# Patient Record
Sex: Female | Born: 1940 | Race: White | Hispanic: No | Smoking: Current every day smoker
Health system: Southern US, Community
[De-identification: ages and names within clinical notes are randomized; demographics above are authoritative.]

## PROBLEM LIST (undated history)

## (undated) DIAGNOSIS — H9190 Unspecified hearing loss, unspecified ear: Secondary | ICD-10-CM

## (undated) DIAGNOSIS — M1990 Unspecified osteoarthritis, unspecified site: Secondary | ICD-10-CM

## (undated) DIAGNOSIS — I509 Heart failure, unspecified: Secondary | ICD-10-CM

---

## 2019-09-06 ENCOUNTER — Other Ambulatory Visit: Payer: Self-pay

## 2019-09-06 ENCOUNTER — Emergency Department (HOSPITAL_COMMUNITY): Payer: Medicare Other

## 2019-09-06 ENCOUNTER — Inpatient Hospital Stay (HOSPITAL_COMMUNITY)
Admission: EM | Admit: 2019-09-06 | Discharge: 2019-09-09 | DRG: 482 | Disposition: A | Discharge status: Home Health | Payer: Medicare Other | Attending: Internal Medicine | Admitting: Internal Medicine

## 2019-09-06 DIAGNOSIS — Y92009 Unspecified place in unspecified non-institutional (private) residence as the place of occurrence of the external cause: Secondary | ICD-10-CM

## 2019-09-06 DIAGNOSIS — S72142A Displaced intertrochanteric fracture of left femur, initial encounter for closed fracture: Principal | ICD-10-CM | POA: Diagnosis present

## 2019-09-06 DIAGNOSIS — Z419 Encounter for procedure for purposes other than remedying health state, unspecified: Secondary | ICD-10-CM

## 2019-09-06 DIAGNOSIS — S72002A Fracture of unspecified part of neck of left femur, initial encounter for closed fracture: Secondary | ICD-10-CM

## 2019-09-06 DIAGNOSIS — W19XXXA Unspecified fall, initial encounter: Secondary | ICD-10-CM

## 2019-09-06 DIAGNOSIS — F17210 Nicotine dependence, cigarettes, uncomplicated: Secondary | ICD-10-CM | POA: Diagnosis present

## 2019-09-06 DIAGNOSIS — Z20828 Contact with and (suspected) exposure to other viral communicable diseases: Secondary | ICD-10-CM | POA: Diagnosis present

## 2019-09-06 DIAGNOSIS — W08XXXA Fall from other furniture, initial encounter: Secondary | ICD-10-CM | POA: Diagnosis present

## 2019-09-06 DIAGNOSIS — E559 Vitamin D deficiency, unspecified: Secondary | ICD-10-CM | POA: Diagnosis present

## 2019-09-06 DIAGNOSIS — T148XXA Other injury of unspecified body region, initial encounter: Secondary | ICD-10-CM

## 2019-09-06 HISTORY — DX: Unspecified osteoarthritis, unspecified site: M19.90

## 2019-09-06 HISTORY — DX: Unspecified hearing loss, unspecified ear: H91.90

## 2019-09-06 MED ORDER — NICOTINE 21 MG/24HR TD PT24
21.0000 mg | MEDICATED_PATCH | Freq: Once | TRANSDERMAL | Status: AC
  Administered 2019-09-06: 21 mg via TRANSDERMAL
  Filled 2019-09-06: qty 1

## 2019-09-06 MED ORDER — FENTANYL CITRATE (PF) 100 MCG/2ML IJ SOLN
50.0000 ug | Freq: Once | INTRAMUSCULAR | Status: AC
  Administered 2019-09-06: 50 ug via INTRAVENOUS
  Filled 2019-09-06: qty 2

## 2019-09-06 NOTE — ED Notes (Signed)
Pt in xray at this time, will obtain EKG when pt returns to room.

## 2019-09-06 NOTE — ED Notes (Signed)
Patient transported to X-ray 

## 2019-09-06 NOTE — ED Notes (Signed)
Discussed NPO status with pt. And family.Pt verbalized understanding

## 2019-09-06 NOTE — ED Triage Notes (Signed)
Pt brought in by [facility] c/o fall. Pt states she "fell from step stool and heard a pop in her left hip" pt denies taking any daily medications. Pt denies LOC, denies room spinning, denies black out. Pt states she step off stool incorrectly.  HX HTN, no other medical history.  Pt A&Ox4  EMS VS BP 122/64 HR 90 NSR SPO2 93% RA RR 17

## 2019-09-06 NOTE — ED Provider Notes (Signed)
[facility]PART[facility] Provider Note   CSN: [id number] Arrival date & time: 09/06/19  2216     History   Chief Complaint Chief Complaint  Patient presents with  . Fall    HPI Annette Berry is a 78 y.o. female.     Patient to ED after stepping off her cough "the wrong way", falling and injuring her left hip. She did not hit her head. She denies headache, neck or back pain, chest pain, abdominal injury or SOB. She complains only of left hip pain where she heard a "pop" when she fell.   The history is provided by the patient and a relative. No language interpreter was used.  Fall Pertinent negatives include no chest pain, no abdominal pain, no headaches and no shortness of breath.    No past medical history on file.  There are no active problems to display for this patient.     OB History   No obstetric history on file.      Home Medications    Prior to Admission medications   Not on File    Family History No family history on file.  Social History Social History   Tobacco Use  . Smoking status: Not on file  Substance Use Topics  . Alcohol use: Not on file  . Drug use: Not on file     Allergies   Patient has no allergy information on record.   Review of Systems Review of Systems  Constitutional: Negative for diaphoresis.  Respiratory: Negative.  Negative for shortness of breath.   Cardiovascular: Negative.  Negative for chest pain.  Gastrointestinal: Negative.  Negative for abdominal pain and nausea.  Musculoskeletal:       See HPI.  Skin: Negative.  Negative for wound.  Neurological: Negative.  Negative for syncope and headaches.     Physical Exam Updated Vital Signs BP 131/90   Pulse 98   Temp 98.7 F (37.1 C) (Oral)   Resp 17   Ht 5\' 4"  (1.626 m)   Wt 54.4 kg   SpO2 93%   BMI 20.60 kg/m   Physical Exam Vitals signs and nursing note reviewed.  Constitutional:      Appearance: She is  well-developed.  HENT:     Head: Normocephalic.  Neck:     Musculoskeletal: Normal range of motion and neck supple.  Cardiovascular:     Rate and Rhythm: Normal rate and regular rhythm.  Pulmonary:     Effort: Pulmonary effort is normal.     Breath sounds: Rales present.  Chest:     Chest wall: No tenderness.  Abdominal:     General: Bowel sounds are normal.     Palpations: Abdomen is soft.     Tenderness: There is no abdominal tenderness. There is no guarding or rebound.  Musculoskeletal: Normal range of motion.     Comments: No midline or paraspinal tenderness over cervical, thoracic and lumbar spine. There is shortening and mild rotation of left LE. Tender over hip prominence on left. No distal thigh, knee or lower leg tenderness.    Skin:    General: Skin is warm and dry.     Findings: No rash.  Neurological:     Mental Status: She is alert.     Cranial Nerves: No cranial nerve deficit.      ED Treatments / Results  Labs (all labs ordered are listed, but only abnormal results are displayed) Labs Reviewed  CBC WITH DIFFERENTIAL/PLATELET  PROTIME-INR  COMPREHENSIVE METABOLIC PANEL    EKG None  Radiology No results found.  Procedures Procedures (including critical care time)  Medications Ordered in ED Medications - No data to display   Initial Impression / Assessment and Plan / ED Course  I have reviewed the triage vital signs and the nursing notes.  Pertinent labs & imaging results that were available during my care of the patient were reviewed by me and considered in my medical decision making (see chart for details).        Patient to ED after fall while stepping off of her couch, landing on left hip, hearing a "pop". No other injury. No medical problems or daily medications. She is a 2 ppd smoker.   Suspect left hip fracture with history of injury and leg shortening with rotation. No other injury identified.   Imaging confirms left intertrochanteric  fracture. Discussed with dr. Jena Gauss, ortho, who will plan for surgery tomorrow. Will keep her NPO after midnight.   Discussed with Dr. Toniann Fail, [facility], who accepts the patient for admission.  Final Clinical Impressions(s) / ED Diagnoses   Final diagnoses:  None   1. Left hip fracture   ED Discharge Orders    None       Elpidio Anis, PA-C 09/07/19 0044    Dione Booze, MD 09/07/19 [id number]

## 2019-09-06 NOTE — ED Notes (Signed)
PA at bedside.

## 2019-09-07 ENCOUNTER — Inpatient Hospital Stay (HOSPITAL_COMMUNITY): Payer: Medicare Other

## 2019-09-07 ENCOUNTER — Encounter (HOSPITAL_COMMUNITY): Payer: Self-pay | Admitting: Internal Medicine

## 2019-09-07 ENCOUNTER — Encounter (HOSPITAL_COMMUNITY)
Admission: EM | Disposition: A | Discharge status: Home Health | Payer: Self-pay | Source: Home / Self Care | Attending: Internal Medicine

## 2019-09-07 ENCOUNTER — Inpatient Hospital Stay (HOSPITAL_COMMUNITY): Payer: Medicare Other | Admitting: Certified Registered"

## 2019-09-07 DIAGNOSIS — Y92009 Unspecified place in unspecified non-institutional (private) residence as the place of occurrence of the external cause: Secondary | ICD-10-CM | POA: Diagnosis not present

## 2019-09-07 DIAGNOSIS — W08XXXA Fall from other furniture, initial encounter: Secondary | ICD-10-CM | POA: Diagnosis present

## 2019-09-07 DIAGNOSIS — W19XXXD Unspecified fall, subsequent encounter: Secondary | ICD-10-CM | POA: Diagnosis not present

## 2019-09-07 DIAGNOSIS — F17210 Nicotine dependence, cigarettes, uncomplicated: Secondary | ICD-10-CM | POA: Diagnosis present

## 2019-09-07 DIAGNOSIS — E559 Vitamin D deficiency, unspecified: Secondary | ICD-10-CM | POA: Diagnosis present

## 2019-09-07 DIAGNOSIS — S72002D Fracture of unspecified part of neck of left femur, subsequent encounter for closed fracture with routine healing: Secondary | ICD-10-CM | POA: Diagnosis not present

## 2019-09-07 DIAGNOSIS — S72142A Displaced intertrochanteric fracture of left femur, initial encounter for closed fracture: Secondary | ICD-10-CM

## 2019-09-07 DIAGNOSIS — S72002A Fracture of unspecified part of neck of left femur, initial encounter for closed fracture: Secondary | ICD-10-CM

## 2019-09-07 DIAGNOSIS — Z20828 Contact with and (suspected) exposure to other viral communicable diseases: Secondary | ICD-10-CM | POA: Diagnosis present

## 2019-09-07 HISTORY — PX: INTRAMEDULLARY (IM) NAIL INTERTROCHANTERIC: SHX5875

## 2019-09-07 LAB — COMPREHENSIVE METABOLIC PANEL
ALT: 13 U/L (ref 0–44)
ALT: 14 U/L (ref 0–44)
AST: 19 U/L (ref 15–41)
AST: 18 U/L (ref 15–41)
Albumin: 4 g/dL (ref 3.5–5.0)
Albumin: 3.7 g/dL (ref 3.5–5.0)
Alkaline Phosphatase: 63 U/L (ref 38–126)
Alkaline Phosphatase: 59 U/L (ref 38–126)
Anion gap: 12 (ref 5–15)
Anion gap: 10 (ref 5–15)
BUN: 16 mg/dL (ref 8–23)
BUN: 14 mg/dL (ref 8–23)
CO2: 22 mmol/L (ref 22–32)
CO2: 24 mmol/L (ref 22–32)
Calcium: 9.4 mg/dL (ref 8.9–10.3)
Calcium: 8.9 mg/dL (ref 8.9–10.3)
Chloride: 101 mmol/L (ref 98–111)
Chloride: 100 mmol/L (ref 98–111)
Creatinine, Ser: 0.73 mg/dL (ref 0.44–1.00)
Creatinine, Ser: 0.69 mg/dL (ref 0.44–1.00)
GFR calc Af Amer: >60 mL/min (ref >60)
GFR calc Af Amer: >60 mL/min (ref >60)
GFR calc non Af Amer: >60 mL/min (ref >60)
GFR calc non Af Amer: >60 mL/min (ref >60)
Glucose, Bld: 125 mg/dL — ABNORMAL HIGH (ref 70–99)
Glucose, Bld: 146 mg/dL — ABNORMAL HIGH (ref 70–99)
Potassium: 4.3 mmol/L (ref 3.5–5.1)
Potassium: 4.1 mmol/L (ref 3.5–5.1)
Sodium: 135 mmol/L (ref 135–145)
Sodium: 134 mmol/L — ABNORMAL LOW (ref 135–145)
Total Bilirubin: 0.2 mg/dL — ABNORMAL LOW (ref 0.3–1.2)
Total Bilirubin: 0.7 mg/dL (ref 0.3–1.2)
Total Protein: 6.8 g/dL (ref 6.5–8.1)
Total Protein: 6.3 g/dL — ABNORMAL LOW (ref 6.5–8.1)

## 2019-09-07 LAB — CBC WITH DIFFERENTIAL/PLATELET
Abs Immature Granulocytes: 0.06 10*3/uL (ref 0.00–0.07)
Abs Immature Granulocytes: 0.04 10*3/uL (ref 0.00–0.07)
Basophils Absolute: 0 10*3/uL (ref 0.0–0.1)
Basophils Absolute: 0 10*3/uL (ref 0.0–0.1)
Basophils Relative: 0 %
Basophils Relative: 0 %
Eosinophils Absolute: 0.1 10*3/uL (ref 0.0–0.5)
Eosinophils Absolute: 0 10*3/uL (ref 0.0–0.5)
Eosinophils Relative: 1 %
Eosinophils Relative: 0 %
HCT: 43.5 % (ref 36.0–46.0)
HCT: 38.7 % (ref 36.0–46.0)
Hemoglobin: 14.9 g/dL (ref 12.0–15.0)
Hemoglobin: 13.2 g/dL (ref 12.0–15.0)
Immature Granulocytes: 1 %
Immature Granulocytes: 0 %
Lymphocytes Relative: 13 %
Lymphocytes Relative: 9 %
Lymphs Abs: 1.5 10*3/uL (ref 0.7–4.0)
Lymphs Abs: 0.9 10*3/uL (ref 0.7–4.0)
MCH: 31.3 pg (ref 26.0–34.0)
MCH: 30.3 pg (ref 26.0–34.0)
MCHC: 34.3 g/dL (ref 30.0–36.0)
MCHC: 34.1 g/dL (ref 30.0–36.0)
MCV: 91.4 fL (ref 80.0–100.0)
MCV: 89 fL (ref 80.0–100.0)
Monocytes Absolute: 0.6 10*3/uL (ref 0.1–1.0)
Monocytes Absolute: 0.7 10*3/uL (ref 0.1–1.0)
Monocytes Relative: 6 %
Monocytes Relative: 7 %
Neutro Abs: 9.2 10*3/uL — ABNORMAL HIGH (ref 1.7–7.7)
Neutro Abs: 9.1 10*3/uL — ABNORMAL HIGH (ref 1.7–7.7)
Neutrophils Relative %: 79 %
Neutrophils Relative %: 84 %
Platelets: 298 10*3/uL (ref 150–400)
Platelets: 260 10*3/uL (ref 150–400)
RBC: 4.76 MIL/uL (ref 3.87–5.11)
RBC: 4.35 MIL/uL (ref 3.87–5.11)
RDW: 12.9 % (ref 11.5–15.5)
RDW: 12.6 % (ref 11.5–15.5)
WBC: 11.4 10*3/uL — ABNORMAL HIGH (ref 4.0–10.5)
WBC: 10.8 10*3/uL — ABNORMAL HIGH (ref 4.0–10.5)
nRBC: 0 % (ref 0.0–0.2)
nRBC: 0 % (ref 0.0–0.2)

## 2019-09-07 LAB — PROTIME-INR
INR: 1 (ref 0.8–1.2)
Prothrombin Time: 13.2 seconds (ref 11.4–15.2)

## 2019-09-07 LAB — VITAMIN D 25 HYDROXY (VIT D DEFICIENCY, FRACTURES): Vit D, 25-Hydroxy: 19.44 ng/mL — ABNORMAL LOW (ref 30–100)

## 2019-09-07 LAB — SURGICAL PCR SCREEN
MRSA, PCR: NEGATIVE
Staphylococcus aureus: NEGATIVE

## 2019-09-07 LAB — SARS CORONAVIRUS 2 (TAT 6-24 HRS): SARS Coronavirus 2: NEGATIVE

## 2019-09-07 SURGERY — FIXATION, FRACTURE, INTERTROCHANTERIC, WITH INTRAMEDULLARY ROD
Anesthesia: Spinal | Site: Hip | Laterality: Left

## 2019-09-07 MED ORDER — ACETAMINOPHEN 500 MG PO TABS
1000.0000 mg | ORAL_TABLET | Freq: Four times a day (QID) | ORAL | Status: DC
  Administered 2019-09-07 – 2019-09-09 (×9): 1000 mg via ORAL
  Filled 2019-09-07 (×9): qty 2

## 2019-09-07 MED ORDER — VANCOMYCIN HCL 1000 MG IV SOLR
INTRAVENOUS | Status: AC
  Filled 2019-09-07: qty 1000

## 2019-09-07 MED ORDER — ONDANSETRON HCL 4 MG/2ML IJ SOLN
INTRAMUSCULAR | Status: DC | PRN
  Administered 2019-09-07: 4 mg via INTRAVENOUS

## 2019-09-07 MED ORDER — PHENYLEPHRINE 40 MCG/ML (10ML) SYRINGE FOR IV PUSH (FOR BLOOD PRESSURE SUPPORT)
PREFILLED_SYRINGE | INTRAVENOUS | Status: DC | PRN
  Administered 2019-09-07 (×2): 200 ug via INTRAVENOUS

## 2019-09-07 MED ORDER — 0.9 % SODIUM CHLORIDE (POUR BTL) OPTIME
TOPICAL | Status: DC | PRN
  Administered 2019-09-07: 10:00:00 1000 mL

## 2019-09-07 MED ORDER — ENSURE ENLIVE PO LIQD: 237.0000 mL | Freq: Two times a day (BID) | ORAL | Status: DC

## 2019-09-07 MED ORDER — FENTANYL CITRATE (PF) 250 MCG/5ML IJ SOLN
INTRAMUSCULAR | Status: AC
  Filled 2019-09-07: qty 5

## 2019-09-07 MED ORDER — SODIUM CHLORIDE 0.9 % IV SOLN
INTRAVENOUS | Status: DC | PRN
  Administered 2019-09-07: 50 ug/min via INTRAVENOUS

## 2019-09-07 MED ORDER — SUCCINYLCHOLINE CHLORIDE 200 MG/10ML IV SOSY
PREFILLED_SYRINGE | INTRAVENOUS | Status: AC
  Filled 2019-09-07: qty 10

## 2019-09-07 MED ORDER — MORPHINE SULFATE (PF) 2 MG/ML IV SOLN
1.0000 mg | INTRAVENOUS | Status: DC | PRN
  Administered 2019-09-07: 1 mg via INTRAVENOUS
  Filled 2019-09-07: qty 1

## 2019-09-07 MED ORDER — FENTANYL CITRATE (PF) 100 MCG/2ML IJ SOLN: 25.0000 ug | INTRAMUSCULAR | Status: DC | PRN

## 2019-09-07 MED ORDER — VANCOMYCIN HCL 1000 MG IV SOLR
INTRAVENOUS | Status: DC | PRN
  Administered 2019-09-07: 1000 mg

## 2019-09-07 MED ORDER — DEXAMETHASONE SODIUM PHOSPHATE 10 MG/ML IJ SOLN
INTRAMUSCULAR | Status: DC | PRN
  Administered 2019-09-07: 10 mg via INTRAVENOUS

## 2019-09-07 MED ORDER — ROCURONIUM BROMIDE 10 MG/ML (PF) SYRINGE
PREFILLED_SYRINGE | INTRAVENOUS | Status: AC
  Filled 2019-09-07: qty 10

## 2019-09-07 MED ORDER — ONDANSETRON HCL 4 MG/2ML IJ SOLN: 4.0000 mg | Freq: Once | INTRAMUSCULAR | Status: DC | PRN

## 2019-09-07 MED ORDER — MORPHINE SULFATE (PF) 2 MG/ML IV SOLN: 0.5000 mg | INTRAVENOUS | Status: DC | PRN

## 2019-09-07 MED ORDER — ACETAMINOPHEN 500 MG PO TABS: 1000.0000 mg | ORAL_TABLET | Freq: Once | ORAL | Status: DC

## 2019-09-07 MED ORDER — MUPIROCIN 2 % EX OINT
1.0000 "application " | TOPICAL_OINTMENT | Freq: Two times a day (BID) | CUTANEOUS | Status: DC
  Administered 2019-09-08 – 2019-09-09 (×2): 1 via NASAL
  Filled 2019-09-07: qty 22

## 2019-09-07 MED ORDER — CEFAZOLIN SODIUM-DEXTROSE 2-4 GM/100ML-% IV SOLN
2.0000 g | Freq: Three times a day (TID) | INTRAVENOUS | Status: AC
  Administered 2019-09-07 – 2019-09-08 (×3): 2 g via INTRAVENOUS
  Filled 2019-09-07 (×3): qty 100

## 2019-09-07 MED ORDER — BUPIVACAINE IN DEXTROSE 0.75-8.25 % IT SOLN
INTRATHECAL | Status: DC | PRN
  Administered 2019-09-07: 1.6 mL via INTRATHECAL

## 2019-09-07 MED ORDER — TRAMADOL HCL 50 MG PO TABS
50.0000 mg | ORAL_TABLET | Freq: Four times a day (QID) | ORAL | Status: DC | PRN
  Administered 2019-09-07 – 2019-09-09 (×4): 50 mg via ORAL
  Filled 2019-09-07 (×4): qty 1

## 2019-09-07 MED ORDER — KETAMINE HCL 50 MG/5ML IJ SOSY
PREFILLED_SYRINGE | INTRAMUSCULAR | Status: AC
  Filled 2019-09-07: qty 5

## 2019-09-07 MED ORDER — PROPOFOL 10 MG/ML IV BOLUS
INTRAVENOUS | Status: AC
  Filled 2019-09-07: qty 20

## 2019-09-07 MED ORDER — KETAMINE HCL 10 MG/ML IJ SOLN
INTRAMUSCULAR | Status: DC | PRN
  Administered 2019-09-07: 25 mg via INTRAVENOUS

## 2019-09-07 MED ORDER — PROPOFOL 10 MG/ML IV BOLUS
INTRAVENOUS | Status: DC | PRN
  Administered 2019-09-07: 20 mg via INTRAVENOUS

## 2019-09-07 MED ORDER — CEFAZOLIN SODIUM-DEXTROSE 2-3 GM-%(50ML) IV SOLR
INTRAVENOUS | Status: DC | PRN
  Administered 2019-09-07: 2 g via INTRAVENOUS

## 2019-09-07 MED ORDER — LIDOCAINE 2% (20 MG/ML) 5 ML SYRINGE
INTRAMUSCULAR | Status: AC
  Filled 2019-09-07: qty 5

## 2019-09-07 MED ORDER — CEFAZOLIN SODIUM-DEXTROSE 1-4 GM/50ML-% IV SOLN
INTRAVENOUS | Status: AC
  Filled 2019-09-07: qty 50

## 2019-09-07 MED ORDER — MIDAZOLAM HCL 2 MG/2ML IJ SOLN
INTRAMUSCULAR | Status: AC
  Filled 2019-09-07: qty 2

## 2019-09-07 MED ORDER — ENOXAPARIN SODIUM 40 MG/0.4ML ~~LOC~~ SOLN
40.0000 mg | SUBCUTANEOUS | Status: DC
  Administered 2019-09-08 – 2019-09-09 (×2): 40 mg via SUBCUTANEOUS
  Filled 2019-09-07 (×2): qty 0.4

## 2019-09-07 MED ORDER — MIDAZOLAM HCL 5 MG/5ML IJ SOLN
INTRAMUSCULAR | Status: DC | PRN
  Administered 2019-09-07: 2 mg via INTRAVENOUS

## 2019-09-07 MED ORDER — LACTATED RINGERS IV SOLN
INTRAVENOUS | Status: DC
  Administered 2019-09-07 – 2019-09-08 (×2): via INTRAVENOUS

## 2019-09-07 SURGICAL SUPPLY — 48 items
BIT DRILL LONG 4.0 (BIT) ×1 IMPLANT
BRUSH SCRUB EZ PLAIN DRY (MISCELLANEOUS) ×6 IMPLANT
CHLORAPREP W/TINT 26 (MISCELLANEOUS) ×3 IMPLANT
COVER PERINEAL POST (MISCELLANEOUS) ×3 IMPLANT
COVER SURGICAL LIGHT HANDLE (MISCELLANEOUS) ×3 IMPLANT
COVER WAND RF STERILE (DRAPES) IMPLANT
DERMABOND ADVANCED (GAUZE/BANDAGES/DRESSINGS) ×2
DERMABOND ADVANCED .7 DNX12 (GAUZE/BANDAGES/DRESSINGS) ×1 IMPLANT
DRAPE C-ARM 35X43 STRL (DRAPES) ×3 IMPLANT
DRAPE IMP U-DRAPE 54X76 (DRAPES) ×6 IMPLANT
DRAPE INCISE IOBAN 66X45 STRL (DRAPES) ×3 IMPLANT
DRAPE STERI IOBAN 125X83 (DRAPES) ×3 IMPLANT
DRAPE SURG 17X23 STRL (DRAPES) ×6 IMPLANT
DRAPE U-SHAPE 47X51 STRL (DRAPES) ×3 IMPLANT
DRILL BIT LONG 4.0 (BIT) ×3
DRSG MEPILEX BORDER 4X4 (GAUZE/BANDAGES/DRESSINGS) ×3 IMPLANT
DRSG MEPILEX BORDER 4X8 (GAUZE/BANDAGES/DRESSINGS) ×3 IMPLANT
ELECT REM PT RETURN 9FT ADLT (ELECTROSURGICAL) ×3
ELECTRODE REM PT RTRN 9FT ADLT (ELECTROSURGICAL) ×1 IMPLANT
GLOVE BIO SURGEON STRL SZ 6.5 (GLOVE) ×6 IMPLANT
GLOVE BIO SURGEON STRL SZ7.5 (GLOVE) ×12 IMPLANT
GLOVE BIO SURGEONS STRL SZ 6.5 (GLOVE) ×3
GLOVE BIOGEL PI IND STRL 6.5 (GLOVE) ×1 IMPLANT
GLOVE BIOGEL PI IND STRL 7.5 (GLOVE) ×1 IMPLANT
GLOVE BIOGEL PI INDICATOR 6.5 (GLOVE) ×2
GLOVE BIOGEL PI INDICATOR 7.5 (GLOVE) ×2
GOWN STRL REUS W/ TWL LRG LVL3 (GOWN DISPOSABLE) ×1 IMPLANT
GOWN STRL REUS W/TWL LRG LVL3 (GOWN DISPOSABLE) ×2
GUIDE PIN 3.2X343 (PIN) ×2
GUIDE PIN 3.2X343MM (PIN) ×4
KIT BASIN OR (CUSTOM PROCEDURE TRAY) ×3 IMPLANT
KIT TURNOVER KIT B (KITS) ×3 IMPLANT
MANIFOLD NEPTUNE II (INSTRUMENTS) ×3 IMPLANT
NAIL INTERTAN 10X18 130D 10S (Nail) ×3 IMPLANT
NS IRRIG 1000ML POUR BTL (IV SOLUTION) ×3 IMPLANT
PACK GENERAL/GYN (CUSTOM PROCEDURE TRAY) ×3 IMPLANT
PAD ARMBOARD 7.5X6 YLW CONV (MISCELLANEOUS) ×6 IMPLANT
PIN GUIDE 3.2X343MM (PIN) ×2 IMPLANT
SCREW LAG COMPR KIT 90/85 (Screw) ×3 IMPLANT
SCREW TRIGEN LOW PROF 5.0X32.5 (Screw) ×3 IMPLANT
SUT MNCRL AB 3-0 PS2 18 (SUTURE) ×3 IMPLANT
SUT VIC AB 0 CT1 27 (SUTURE)
SUT VIC AB 0 CT1 27XBRD ANBCTR (SUTURE) IMPLANT
SUT VIC AB 2-0 CT1 27 (SUTURE) ×4
SUT VIC AB 2-0 CT1 TAPERPNT 27 (SUTURE) ×2 IMPLANT
TOWEL GREEN STERILE (TOWEL DISPOSABLE) ×6 IMPLANT
TRAY CATH 16FR W/PLASTIC CATH (SET/KITS/TRAYS/PACK) ×3 IMPLANT
WATER STERILE IRR 1000ML POUR (IV SOLUTION) ×3 IMPLANT

## 2019-09-07 NOTE — Progress Notes (Signed)
Patient seen in her room after surgery today, son at bedside.  Doing well with fairly good pain control.  Reports a couple episodes of emesis last night and this morning.  Suspect secondary to pain meds.  She has no other complaints at this time.  Agree with plans as outlined in admission H&P and per orthopedics.

## 2019-09-07 NOTE — ED Notes (Signed)
Son: Astrid Vides can be reached at [phone] for updates.   Son leaving at this time with pt all of belongings. Discussed visitation hours with son.

## 2019-09-07 NOTE — ED Notes (Signed)
ED TO INPATIENT HANDOFF REPORT  ED Nurse Name and Phone #:    S Name/Age/Gender Danice Goltz 78 y.o. female Room/Bed: 018C/018C  Code Status   Code Status: Not on file  Home/SNF/Other Home Patient oriented to: self, place, time and situation Is this baseline? Yes   Triage Complete: Triage complete  Chief Complaint fall hip pain  Triage Note Pt brought in by [facility] c/o fall. Pt states she "fell from step stool and heard a pop in her left hip" pt denies taking any daily medications. Pt denies LOC, denies room spinning, denies black out. Pt states she step off stool incorrectly.  HX HTN, no other medical history.  Pt A&Ox4  EMS VS BP 122/64 HR 90 NSR SPO2 93% RA RR 17      Allergies Not on File  Level of Care/Admitting Diagnosis ED Disposition    ED Disposition Condition [place]: [place] [[id number]]  Level of Care: Med-Surg [16]  Covid Evaluation: Asymptomatic Screening Protocol (No Symptoms)  Diagnosis: Closed left hip fracture, initial encounter [facility]) [[id number]]  Admitting Physician: Rise Patience [phone]  Attending Physician: Rise Patience [phone]  Estimated length of stay: past midnight tomorrow  Certification:: I certify this patient will need inpatient services for at least 2 midnights  PT Class (Do Not Modify): Inpatient [101]  PT Acc Code (Do Not Modify): Private [1]       B Medical/Surgery History No past medical history on file.   A IV Location/Drains/Wounds Patient Lines/Drains/Airways Status   Active Line/Drains/Airways    Name:   Placement date:   Placement time:   Site:   Days:   Peripheral IV 09/06/19 Left Antecubital   09/06/19    2333    Antecubital   1          Intake/Output Last 24 hours No intake or output data in the 24 hours ending 09/07/19 0247  Labs/Imaging Results for orders placed or performed during the hospital encounter of 09/06/19 (from the past 48  hour(s))  CBC with Differential     Status: Abnormal   Collection Time: 09/06/19 10:50 PM  Result Value Ref Range   WBC 11.4 (H) 4.0 - 10.5 K/uL   RBC 4.76 3.87 - 5.11 MIL/uL   Hemoglobin 14.9 12.0 - 15.0 g/dL   HCT 43.5 36.0 - 46.0 %   MCV 91.4 80.0 - 100.0 fL   MCH 31.3 26.0 - 34.0 pg   MCHC 34.3 30.0 - 36.0 g/dL   RDW 12.9 11.5 - 15.5 %   Platelets 298 150 - 400 K/uL   nRBC 0.0 0.0 - 0.2 %   Neutrophils Relative % 79 %   Neutro Abs 9.2 (H) 1.7 - 7.7 K/uL   Lymphocytes Relative 13 %   Lymphs Abs 1.5 0.7 - 4.0 K/uL   Monocytes Relative 6 %   Monocytes Absolute 0.6 0.1 - 1.0 K/uL   Eosinophils Relative 1 %   Eosinophils Absolute 0.1 0.0 - 0.5 K/uL   Basophils Relative 0 %   Basophils Absolute 0.0 0.0 - 0.1 K/uL   Immature Granulocytes 1 %   Abs Immature Granulocytes 0.06 0.00 - 0.07 K/uL    Comment: Performed at [facility], [address] [place], [place] [address]  Protime-INR     Status: None   Collection Time: 09/06/19 10:50 PM  Result Value Ref Range   Prothrombin Time 13.2 11.4 - 15.2 seconds  INR 1.0 0.8 - 1.2    Comment: (NOTE) INR goal varies based on device and disease states. Performed at [facility], [address], [place], [place] [address]   Comprehensive metabolic panel     Status: Abnormal   Collection Time: 09/06/19 10:50 PM  Result Value Ref Range   Sodium 135 135 - 145 mmol/L   Potassium 4.3 3.5 - 5.1 mmol/L   Chloride 101 98 - 111 mmol/L   CO2 22 22 - 32 mmol/L   Glucose, Bld 125 (H) 70 - 99 mg/dL   BUN 16 8 - 23 mg/dL   Creatinine, Ser 1.60 0.44 - 1.00 mg/dL   Calcium 9.4 8.9 - 10.9 mg/dL   Total Protein 6.8 6.5 - 8.1 g/dL   Albumin 4.0 3.5 - 5.0 g/dL   AST 19 15 - 41 U/L   ALT 13 0 - 44 U/L   Alkaline Phosphatase 63 38 - 126 U/L   Total Bilirubin 0.2 (L) 0.3 - 1.2 mg/dL   GFR calc non Af Amer >60 >60 mL/min   GFR calc Af Amer >60 >60 mL/min   Anion gap 12 5 - 15    Comment: Performed at [facility], [address], [place], [place] [address]   Dg Chest 1 View  Result Date: 09/06/2019 CLINICAL DATA:  Hip fracture EXAM: CHEST  1 VIEW COMPARISON:  None. FINDINGS: No focal opacity or pleural effusion. Normal cardiomediastinal silhouette with aortic atherosclerosis. No pneumothorax. IMPRESSION: No active disease. Electronically Signed   By: Jasmine Pang M.D.   On: 09/06/2019 23:13   Dg Hip Unilat With Pelvis 2-3 Views Left  Result Date: 09/06/2019 CLINICAL DATA:  Fall EXAM: DG HIP (WITH OR WITHOUT PELVIS) 2-3V LEFT COMPARISON:  None. FINDINGS: SI joints are non widened. Possible left sacral ala fracture. Pubic symphysis and rami appear intact. Both femoral heads project in joint. Acute mildly impacted left intertrochanteric fracture. Vascular calcifications. IMPRESSION: 1. Acute mildly impacted left intertrochanteric fracture 2. Possible left sacral ala fracture. Electronically Signed   By: Jasmine Pang M.D.   On: 09/06/2019 23:12    Pending Labs Unresulted Labs (From admission, onward)    Start     Ordered   09/06/19 2321  SARS CORONAVIRUS 2 (TAT 6-24 HRS) Nasopharyngeal Nasopharyngeal Swab  (Asymptomatic/Tier 2 Patients Labs)  Once,   STAT    Question Answer Comment  Is this test for diagnosis or screening Screening   Symptomatic for COVID-19 as defined by CDC No   Hospitalized for COVID-19 No   Admitted to ICU for COVID-19 No   Previously tested for COVID-19 No   Resident in a congregate (group) care setting No   Employed in healthcare setting No   Pregnant No      09/06/19 2320          Vitals/Pain Today's Vitals   09/06/19 2245 09/07/19 0005 09/07/19 0016 09/07/19 0100  BP: (!) 165/87 (!) 153/76  (!) 145/101  Pulse: 93 83  (!) 106  Resp:  11  13  Temp:      TempSrc:      SpO2: 97% 94%  93%  Weight:      Height:      PainSc:   5      Isolation Precautions No active isolations  Medications Medications  nicotine (NICODERM CQ - dosed in mg/24 hours) patch 21 mg (21 mg  Transdermal Patch Applied 09/06/19 2334)  fentaNYL (SUBLIMAZE) injection 50 mcg (50  mcg Intravenous Given 09/06/19 2334)    Mobility non-ambulatory Low fall risk   Focused Assessments Musculoskeletal    R Recommendations: See Admitting Provider Note  Report given to:   Additional Notes: -

## 2019-09-07 NOTE — Plan of Care (Signed)

## 2019-09-07 NOTE — Transfer of Care (Signed)
Immediate Anesthesia Transfer of Care Note  Patient: MINNETTA SANDORA  Procedure(s) Performed: INTRAMEDULLARY (IM) NAIL INTERTROCHANTRIC (Left Hip)  Patient Location: PACU  Anesthesia Type:MAC combined with regional for post-op pain  Level of Consciousness: drowsy and patient cooperative  Airway & Oxygen Therapy: Patient Spontanous Breathing  Post-op Assessment: Report given to RN and Post -op Vital signs reviewed and stable  Post vital signs: Reviewed and stable  Last Vitals:  Vitals Value Taken Time  BP    Temp    Pulse    Resp 13 09/07/19 1045  SpO2    Vitals shown include unvalidated device data.  Last Pain:  Vitals:   09/07/19 0801  TempSrc: Oral  PainSc:       Patients Stated Pain Goal: 1 (28/97/91 5041)  Complications: No apparent anesthesia complications

## 2019-09-07 NOTE — Anesthesia Postprocedure Evaluation (Signed)
Anesthesia Post Note  Patient: CAILYNN BODNAR  Procedure(s) Performed: INTRAMEDULLARY (IM) NAIL INTERTROCHANTRIC (Left Hip)     Patient location during evaluation: PACU Anesthesia Type: Spinal Level of consciousness: oriented and awake and alert Pain management: pain level controlled Vital Signs Assessment: post-procedure vital signs reviewed and stable Respiratory status: spontaneous breathing, respiratory function stable, patient connected to nasal cannula oxygen and nonlabored ventilation Cardiovascular status: blood pressure returned to baseline and stable Postop Assessment: no headache, no backache, no apparent nausea or vomiting, patient able to bend at knees and spinal receding Anesthetic complications: no    Last Vitals:  Vitals:   09/07/19 1130 09/07/19 1155  BP: 114/63 (!) 147/70  Pulse:  97  Resp: 13 15  Temp:  36.8 C  SpO2: 100% 98%    Last Pain:  Vitals:   09/07/19 1155  TempSrc:   PainSc: 0-No pain                 Catalina Gravel

## 2019-09-07 NOTE — Op Note (Signed)
Orthopaedic Surgery Operative Note (CSN: [id number] ) Date of Surgery: 09/07/2019  Admit Date: 09/06/2019   Diagnoses: Pre-Op Diagnoses: Left intertrochanteric femur fracture   Post-Op Diagnosis: Same  Procedures: CPT 27245-Cephalomedullary nailing of left intertrochanteric femur fracture  Surgeons : Primary: Roby Lofts, MD  Assistant: Ulyses Southward, PA-C  Location: OR 3   Anesthesia:General  Antibiotics: Ancef 2g preop   Tourniquet time:None  Estimated Blood Loss:100 mL  Complications:None   Specimens:None   Implants: Implant Name Type Inv. Item Serial No. Manufacturer Lot No. LRB No. Used Action  trigen intertan 10s nail     [id number] Left 1 Implanted  SCREW LAG COMBO 90.85 - [id number] Screw SCREW LAG COMBO 90.85  [employer] [id number] Left 1 Implanted  SCREW TRIGEN LOW PROF 5.0X32.5 - [id number] Screw SCREW TRIGEN LOW PROF 5.0X32.5  [employer] [id number] Left 1 Implanted     Indications for Surgery: 78 year old female with no past medical history but significant tobacco use history with a left intertrochanteric femur fracture. Due to the displacement and unstable nature of her injury I recommended proceeding with cephalomedullary nailing of left intertrochanteric femur fracture.  Risks and benefits were discussed with the patient.  Risks included but not limited to bleeding, infection, malunion, nonunion, hardware failure, cut out, DVT, nerve and blood vessel injury, even the possibility anesthetic complications.  Patient agrees to proceed with surgery and consent was obtained.   Operative Findings: Cephalomedullary nailing of left intertrochanteric femur fracture using [employer] InterTAN 10 x 170 millimeter short nail with 43mm lag and compression screw  Procedure: The patient was identified in the preoperative holding area. Consent was confirmed with the patient and their family and all questions were answered. The operative  extremity was marked after confirmation with the patient and they were then brought back to the operating room by our anesthesia colleagues. The patient was placed under general anesthesia and then carefully transferred over to a Hana table. The feet were secured into a traction boot and well padded. A post was placed in the groin and traction was pulled on the operative leg. The contralateral leg was positioned out of the way of fluoroscopy and secure . Fluoroscopic images were obtained and traction and manipulation was performed to reduce the fracture. Once adequate reduction was performed then the operative extremity was prepped and draped in sterile fashion. Preincision timeout was performed to verify the patient, the procedure and the extremity. Preoperative antibiotics were dosed.  A small incision was made proximal to the greater trochanter. A curved Mayo scissors was used to spread down to the greater trochanter in line with the abductor musculature. A threaded guidepin was positioned at an appropriate starting point on the AP and lateral views. It was advanced in the femur past the lesser trochanter. A entry reamer with soft tissue protector was then used to enter the canal. A 10 mm short nail was placed into the canal and seated down to an appropriate position radiographically. The targeting arm for the lag and compression screw was attached. A threaded guidepin was placed into the femoral neck and head and fluoroscopy was used to confirm adequate placement with an acceptable tip-apex distance. The compression screw position was drilled and anti-rotation bar was placed. The lag screw path was drilled and the lag screw was placed with good fixation. The compression screw was then placed and about 3 mm of compression was obtained. The set set was tightened and a distal interlocking screw was placed  using the jig.  Final fluoroscopic images were obtained and the incisions were copiously irrigated. The  skin was closed with 2-0 vicryl, 3-0 monocryl and sealed with dermabond. The incisions were dressing with Mepilex dressings. The patient was carefully transferred to the regular floor bed and was taken to PACU in stable condition.  Post Op Plan/Instructions: Patient will be weightbearing as tolerated to left lower extremity.  She will receive postoperative Ancef.  She will be started on Lovenox for DVT prophylaxis.  She will mobilize with physical and Occupational Therapy.  I was present and performed the entire surgery.  Patrecia Pace, PA-C did assist me throughout the case. An assistant was necessary given the difficulty in approach, maintenance of reduction and ability to instrument the fracture.  Katha Hamming, MD [facility]

## 2019-09-07 NOTE — Progress Notes (Signed)
Initial Nutrition Assessment  DOCUMENTATION CODES:   Not applicable  INTERVENTION:  Provide Ensure Enlive po BID, each supplement provides 350 kcal and 20 grams of protein  Encourage adequate PO intake.   NUTRITION DIAGNOSIS:   Increased nutrient needs related to post-op healing as evidenced by estimated needs.  GOAL:   Patient will meet greater than or equal to 90% of their needs  MONITOR:   PO intake, Supplement acceptance, Skin, Weight trends, Labs, I & O's  REASON FOR ASSESSMENT:   Consult Hip fracture protocol  ASSESSMENT:   78 y.o. female with no significant past medical history had a fall at home. X-rays revealed left hip fracture.   Procedures (10/12): Cephalomedullary nailing of left intertrochanteric femur fracture  Pt unavailable during attempted time of visit, in OR. RD unable to obtain pt nutrition history. RD to order nutritional supplements to aid in post op healing. Unable to complete Nutrition-Focused physical exam at this time.   Labs and medications reviewed.   Diet Order:   Diet Order            Diet regular Room service appropriate? Yes; Fluid consistency: Thin  Diet effective now              EDUCATION NEEDS:   Not appropriate for education at this time  Skin:  Skin Assessment: Skin Integrity Issues: Skin Integrity Issues:: Incisions Incisions: L hip  Last BM:  10/11  Height:   Ht Readings from Last 1 Encounters:  09/06/19 5\' 4"  (1.626 m)    Weight:   Wt Readings from Last 1 Encounters:  09/06/19 54.4 kg    Ideal Body Weight:  54.5 kg  BMI:  Body mass index is 20.6 kg/m.  Estimated Nutritional Needs:   Kcal:  1650-1850  Protein:  70-85 grams  Fluid:  >/= 1.6 L/day    Corrin Parker, MS, RD, LDN Pager # [phone] After hours/ weekend pager # [phone]

## 2019-09-07 NOTE — H&P (Signed)
History and Physical    PRISMA DECARLO LGX:[id number] DOB: Jun 30, 1941 DOA: 09/06/2019  PCP: Leonard Downing, MD  Patient coming from: Home.  Chief Complaint: Fall.  HPI: Annette Berry is a 78 y.o. female with no significant past medical history had a fall at home while patient was working on the window blinds.  Patient fell onto the floor did not hit her head or lose consciousness did not have any chest pain or shortness of breath.  Was brought to the ER.  ED Course: In the ER x-rays revealed left hip fracture.  On-call orthopedic surgeon Dr. Doreatha Martin has been consulted.  Patient admitted for hip fracture.  Labs revealed mild leukocytosis WBC count 11.4 glucose 125 creatinine 0.7 hemoglobin 14.9 platelets 298 EKG shows normal sinus rhythm with biatrial enlargement.  Chest x-ray is normal.  Patient admitted for left hip fracture secondary to mechanical fall.  Review of Systems: As per HPI, rest all negative.   History reviewed. No pertinent past medical history.  History reviewed. No pertinent surgical history.   reports that she has been smoking. She has never used smokeless tobacco. No history on file for alcohol and drug.  Not on File  Family History  Problem Relation Age of Onset  . Diabetes Mellitus I Neg Hx     Prior to Admission medications   Not on File    Physical Exam: Constitutional: Moderately built and nourished. Vitals:   09/06/19 2245 09/07/19 0005 09/07/19 0100 09/07/19 0350  BP: (!) 165/87 (!) 153/76 (!) 145/101 133/75  Pulse: 93 83 (!) 106 98  Resp:  11 13 17   Temp:    98.9 F (37.2 C)  TempSrc:    Oral  SpO2: 97% 94% 93% 95%  Weight:      Height:       Eyes: Anicteric no pallor. ENMT: No discharge from the ears eyes nose or mouth. Neck: No mass felt.  No neck rigidity. Respiratory: No rhonchi or crepitations. Cardiovascular: S1-S2 heard. Abdomen: Soft nontender bowel sounds present. Musculoskeletal: No edema.  Pain on  moving left hip. Skin: No rash. Neurologic: Alert awake oriented to time place and person.  Moves all extremities. Psychiatric: Appears normal per normal affect.   Labs on Admission: I have personally reviewed following labs and imaging studies  CBC: Recent Labs  Lab 09/06/19 2250  WBC 11.4*  NEUTROABS 9.2*  HGB 14.9  HCT 43.5  MCV 91.4  PLT 814   Basic Metabolic Panel: Recent Labs  Lab 09/06/19 2250  NA 135  K 4.3  CL 101  CO2 22  GLUCOSE 125*  BUN 16  CREATININE 0.73  CALCIUM 9.4   GFR: Estimated Creatinine Clearance: 50.6 mL/min (by C-G formula based on SCr of 0.73 mg/dL). Liver Function Tests: Recent Labs  Lab 09/06/19 2250  AST 19  ALT 13  ALKPHOS 63  BILITOT 0.2*  PROT 6.8  ALBUMIN 4.0   No results for input(s): LIPASE, AMYLASE in the last 168 hours. No results for input(s): AMMONIA in the last 168 hours. Coagulation Profile: Recent Labs  Lab 09/06/19 2250  INR 1.0   Cardiac Enzymes: No results for input(s): CKTOTAL, CKMB, CKMBINDEX, TROPONINI in the last 168 hours. BNP (last 3 results) No results for input(s): PROBNP in the last 8760 hours. HbA1C: No results for input(s): HGBA1C in the last 72 hours. CBG: No results for input(s): GLUCAP in the last 168 hours. Lipid Profile: No results for input(s): CHOL, HDL, LDLCALC, TRIG, CHOLHDL, LDLDIRECT  in the last 72 hours. Thyroid Function Tests: No results for input(s): TSH, T4TOTAL, FREET4, T3FREE, THYROIDAB in the last 72 hours. Anemia Panel: No results for input(s): VITAMINB12, FOLATE, FERRITIN, TIBC, IRON, RETICCTPCT in the last 72 hours. Urine analysis: No results found for: COLORURINE, APPEARANCEUR, LABSPEC, PHURINE, GLUCOSEU, HGBUR, BILIRUBINUR, KETONESUR, PROTEINUR, UROBILINOGEN, NITRITE, LEUKOCYTESUR Sepsis Labs: @LABRCNTIP (procalcitonin:4,lacticidven:4) ) Recent Results (from the past 240 hour(s))  SARS CORONAVIRUS 2 (TAT 6-24 HRS) Nasopharyngeal Nasopharyngeal Swab     Status: None    Collection Time: 09/06/19 11:39 PM   Specimen: Nasopharyngeal Swab  Result Value Ref Range Status   SARS Coronavirus 2 NEGATIVE NEGATIVE Final    Comment: (NOTE) SARS-CoV-2 target nucleic acids are NOT DETECTED. The SARS-CoV-2 RNA is generally detectable in upper and lower respiratory specimens during the acute phase of infection. Negative results do not preclude SARS-CoV-2 infection, do not rule out co-infections with other pathogens, and should not be used as the sole basis for treatment or other patient management decisions. Negative results must be combined with clinical observations, patient history, and epidemiological information. The expected result is Negative. Fact Sheet for Patients: 11/06/19 Fact Sheet for Healthcare Providers: HairSlick.no This test is not yet approved or cleared by the quierodirigir.com FDA and  has been authorized for detection and/or diagnosis of SARS-CoV-2 by FDA under an Emergency Use Authorization (EUA). This EUA will remain  in effect (meaning this test can be used) for the duration of the COVID-19 declaration under Section 56 4(b)(1) of the Act, 21 U.S.C. section 360bbb-3(b)(1), unless the authorization is terminated or revoked sooner. Performed at [facility], [address], [place], [place]      Radiological Exams on Admission: Dg Chest 1 View  Result Date: 09/06/2019 CLINICAL DATA:  Hip fracture EXAM: CHEST  1 VIEW COMPARISON:  None. FINDINGS: No focal opacity or pleural effusion. Normal cardiomediastinal silhouette with aortic atherosclerosis. No pneumothorax. IMPRESSION: No active disease. Electronically Signed   By: 11/06/2019 M.D.   On: 09/06/2019 23:13   Dg Hip Unilat With Pelvis 2-3 Views Left  Result Date: 09/06/2019 CLINICAL DATA:  Fall EXAM: DG HIP (WITH OR WITHOUT PELVIS) 2-3V LEFT COMPARISON:  None. FINDINGS: SI joints are non widened. Possible  left sacral ala fracture. Pubic symphysis and rami appear intact. Both femoral heads project in joint. Acute mildly impacted left intertrochanteric fracture. Vascular calcifications. IMPRESSION: 1. Acute mildly impacted left intertrochanteric fracture 2. Possible left sacral ala fracture. Electronically Signed   By: 11/06/2019 M.D.   On: 09/06/2019 23:12    EKG: Independently reviewed.  Normal sinus rhythm basal enlargement.  Assessment/Plan Principal Problem:   Closed left hip fracture, initial encounter (HCC)    1. Closed left hip fracture status post mechanical fall -patient be at moderate risk for intermediate procedure would keep patient n.p.o. pain relief medication orthopedics to see patient for surgery. 2. Tobacco abuse -tobacco cessation counseling requested.  Given the patient will need surgery and further care and will need more than 2 midnight stay will admit as inpatient.  COVID-19 is pending.   DVT prophylaxis: SCDs in anticipation of surgery. Code Status: Full code. Family Communication: Discussed with patient. Disposition Plan: May need rehab. Consults called: Orthopedics. Admission status: Inpatient.   11/06/2019 MD [facility] Pager [phone].  If 7PM-7AM, please contact night-coverage [email or web address] Password [email or web address]  09/07/2019, 4:56 AM

## 2019-09-07 NOTE — ED Notes (Signed)
Per Microbiology pt COVID to result at 3:45 am

## 2019-09-07 NOTE — Anesthesia Procedure Notes (Signed)
Spinal  Patient location during procedure: OR Start time: 09/07/2019 9:20 AM End time: 09/07/2019 9:25 AM Staffing Anesthesiologist: Catalina Gravel, MD Performed: anesthesiologist  Preanesthetic Checklist Completed: patient identified, surgical consent, pre-op evaluation, timeout performed, IV checked, risks and benefits discussed and monitors and equipment checked Spinal Block Patient position: left lateral decubitus Prep: site prepped and draped and DuraPrep Patient monitoring: continuous pulse ox and blood pressure Approach: midline Location: L3-4 Injection technique: single-shot Needle Needle type: Pencan  Needle gauge: 24 G Additional Notes Functioning IV was confirmed and monitors were applied. Sterile prep and drape, including hand hygiene, mask and sterile gloves were used. The patient was positioned and the spine was prepped. The skin was anesthetized with lidocaine.  Free flow of clear CSF was obtained prior to injecting local anesthetic into the CSF.  The spinal needle aspirated freely following injection.  The needle was carefully withdrawn.  The patient tolerated the procedure well. Consent was obtained prior to procedure with all questions answered and concerns addressed. Risks including but not limited to bleeding, infection, nerve damage, paralysis, failed block, inadequate analgesia, allergic reaction, high spinal, itching and headache were discussed and the patient wished to proceed.   Hoy Morn, MD

## 2019-09-07 NOTE — Plan of Care (Signed)
  Problem: Education: Goal: Knowledge of General Education information will improve Description Including pain rating scale, medication(s)/side effects and non-pharmacologic comfort measures Outcome: Progressing   Problem: Health Behavior/Discharge Planning: Goal: Ability to manage health-related needs will improve Outcome: Progressing   

## 2019-09-07 NOTE — Consult Note (Signed)
Orthopaedic Trauma Service (OTS) Consult   Patient ID: Annette Berry MRN: [id number] DOB/AGE: January 06, 1941 78 y.o.  Reason for Consult:Left hip fracture Referring Physician: Dr. Dione Booze, MD [facility]  HPI: Annette Berry is an 78 y.o. female who is being seen in consultation at the request of Dr. Preston Fleeting for evaluation of left intertrochanteric femur fracture.  The patient was standing on her couch to close her blinds when she tripped fell landed on her hip had immediate pop pain and inability to bear weight.  She was brought to the emergency room where x-rays showed a intertrochanteric femur fracture she has no major medical history.  Unfortunately she does not see a doctor regularly.  She was admitted to the hospitalist service. She smokes about a pack and a half a day.  Patient was seen and evaluated on 5 N.  She is having severe pain in her hip.  Hurts with any motion of the hip.  Denies any pain anywhere else.  Lives alone.  But has family close by.  Numbness or tingling complaints.  No shortness of breath or chest pain.  History reviewed. No pertinent past medical history.  History reviewed. No pertinent surgical history.  Family History  Problem Relation Age of Onset  . Diabetes Mellitus I Neg Hx     Social History:  reports that she has been smoking. She has never used smokeless tobacco. No history on file for alcohol and drug.  Allergies: Not on File  Medications:  No current facility-administered medications on file prior to encounter.    No current outpatient medications on file prior to encounter.    ROS: Constitutional: No fever or chills Vision: No changes in vision ENT: No difficulty swallowing CV: No chest pain Pulm: No SOB or wheezing GI: No nausea or vomiting GU: No urgency or inability to hold urine Skin: No poor wound healing Neurologic: No numbness or tingling Psychiatric: No depression or anxiety Heme: No bruising Allergic: No  reaction to medications or food   Exam: Blood pressure 133/75, pulse 98, temperature 98.9 F (37.2 C), temperature source Oral, resp. rate 17, height 5\' 4"  (1.626 m), weight 54.4 kg, SpO2 95 %. General: No acute distress Orientation: Awake alert and oriented x3 Mood and Affect: Cooperative and pleasant Gait: Unable to ambulate secondary to pain. Coordination and balance: Within normal limits  Left lower extremity reveals a skin without lesions.  Diffuse tenderness palpation about the hip.  No tenderness palpation about the knee or the ankle.  Unable to tolerate any range of motion of the hip or leg.  Able to move her ankle up and down.  Motor and sensory function intact to the left leg.  Warm well-perfused foot.  Compartments are soft and compressible.  No lymphadenopathy.  Reflexes are within normal limits.  No instability about the ankle or knee.  Right lower extremity: Skin without lesions. No tenderness to palpation. Full painless ROM, full strength in each muscle groups without evidence of instability.   Medical Decision Making: Data: Imaging: X-rays reveal a displaced shortened basicervical femoral neck/intertrochanteric fracture  Labs:  Results for orders placed or performed during the hospital encounter of 09/06/19 (from the past 24 hour(s))  CBC with Differential     Status: Abnormal   Collection Time: 09/06/19 10:50 PM  Result Value Ref Range   WBC 11.4 (H) 4.0 - 10.5 K/uL   RBC 4.76 3.87 - 5.11 MIL/uL   Hemoglobin 14.9 12.0 - 15.0 g/dL   HCT  43.5 36.0 - 46.0 %   MCV 91.4 80.0 - 100.0 fL   MCH 31.3 26.0 - 34.0 pg   MCHC 34.3 30.0 - 36.0 g/dL   RDW 16.112.9 09.611.5 - 04.515.5 %   Platelets 298 150 - 400 K/uL   nRBC 0.0 0.0 - 0.2 %   Neutrophils Relative % 79 %   Neutro Abs 9.2 (H) 1.7 - 7.7 K/uL   Lymphocytes Relative 13 %   Lymphs Abs 1.5 0.7 - 4.0 K/uL   Monocytes Relative 6 %   Monocytes Absolute 0.6 0.1 - 1.0 K/uL   Eosinophils Relative 1 %   Eosinophils Absolute 0.1 0.0  - 0.5 K/uL   Basophils Relative 0 %   Basophils Absolute 0.0 0.0 - 0.1 K/uL   Immature Granulocytes 1 %   Abs Immature Granulocytes 0.06 0.00 - 0.07 K/uL  Protime-INR     Status: None   Collection Time: 09/06/19 10:50 PM  Result Value Ref Range   Prothrombin Time 13.2 11.4 - 15.2 seconds   INR 1.0 0.8 - 1.2  Comprehensive metabolic panel     Status: Abnormal   Collection Time: 09/06/19 10:50 PM  Result Value Ref Range   Sodium 135 135 - 145 mmol/L   Potassium 4.3 3.5 - 5.1 mmol/L   Chloride 101 98 - 111 mmol/L   CO2 22 22 - 32 mmol/L   Glucose, Bld 125 (H) 70 - 99 mg/dL   BUN 16 8 - 23 mg/dL   Creatinine, Ser 4.090.73 0.44 - 1.00 mg/dL   Calcium 9.4 8.9 - 81.110.3 mg/dL   Total Protein 6.8 6.5 - 8.1 g/dL   Albumin 4.0 3.5 - 5.0 g/dL   AST 19 15 - 41 U/L   ALT 13 0 - 44 U/L   Alkaline Phosphatase 63 38 - 126 U/L   Total Bilirubin 0.2 (L) 0.3 - 1.2 mg/dL   GFR calc non Af Amer >60 >60 mL/min   GFR calc Af Amer >60 >60 mL/min   Anion gap 12 5 - 15  SARS CORONAVIRUS 2 (TAT 6-24 HRS) Nasopharyngeal Nasopharyngeal Swab     Status: None   Collection Time: 09/06/19 11:39 PM   Specimen: Nasopharyngeal Swab  Result Value Ref Range   SARS Coronavirus 2 NEGATIVE NEGATIVE  Surgical PCR screen     Status: None   Collection Time: 09/07/19  3:39 AM   Specimen: Nasal Mucosa; Nasal Swab  Result Value Ref Range   MRSA, PCR NEGATIVE NEGATIVE   Staphylococcus aureus NEGATIVE NEGATIVE  CBC WITH DIFFERENTIAL     Status: Abnormal   Collection Time: 09/07/19  5:14 AM  Result Value Ref Range   WBC 10.8 (H) 4.0 - 10.5 K/uL   RBC 4.35 3.87 - 5.11 MIL/uL   Hemoglobin 13.2 12.0 - 15.0 g/dL   HCT 91.438.7 78.236.0 - 95.646.0 %   MCV 89.0 80.0 - 100.0 fL   MCH 30.3 26.0 - 34.0 pg   MCHC 34.1 30.0 - 36.0 g/dL   RDW 21.312.6 08.611.5 - 57.815.5 %   Platelets 260 150 - 400 K/uL   nRBC 0.0 0.0 - 0.2 %   Neutrophils Relative % 84 %   Neutro Abs 9.1 (H) 1.7 - 7.7 K/uL   Lymphocytes Relative 9 %   Lymphs Abs 0.9 0.7 - 4.0 K/uL    Monocytes Relative 7 %   Monocytes Absolute 0.7 0.1 - 1.0 K/uL   Eosinophils Relative 0 %   Eosinophils Absolute 0.0 0.0 - 0.5 K/uL  Basophils Relative 0 %   Basophils Absolute 0.0 0.0 - 0.1 K/uL   Immature Granulocytes 0 %   Abs Immature Granulocytes 0.04 0.00 - 0.07 K/uL  Comprehensive metabolic panel     Status: Abnormal   Collection Time: 09/07/19  5:14 AM  Result Value Ref Range   Sodium 134 (L) 135 - 145 mmol/L   Potassium 4.1 3.5 - 5.1 mmol/L   Chloride 100 98 - 111 mmol/L   CO2 24 22 - 32 mmol/L   Glucose, Bld 146 (H) 70 - 99 mg/dL   BUN 14 8 - 23 mg/dL   Creatinine, Ser 0.69 0.44 - 1.00 mg/dL   Calcium 8.9 8.9 - 10.3 mg/dL   Total Protein 6.3 (L) 6.5 - 8.1 g/dL   Albumin 3.7 3.5 - 5.0 g/dL   AST 18 15 - 41 U/L   ALT 14 0 - 44 U/L   Alkaline Phosphatase 59 38 - 126 U/L   Total Bilirubin 0.7 0.3 - 1.2 mg/dL   GFR calc non Af Amer >60 >60 mL/min   GFR calc Af Amer >60 >60 mL/min   Anion gap 10 5 - 15    Imaging or Labs ordered: None  Medical history and chart was reviewed and case discussed with medical provider.  Assessment/Plan: 78 year old female with no past medical history but significant tobacco use history with a left intertrochanteric femur fracture.  Due to the displacement and unstable nature of her injury I recommend proceeding with cephalomedullary nailing of left intertrochanteric femur fracture.  Risks and benefits were discussed with the patient.  Risks included but not limited to bleeding, infection, malunion, nonunion, hardware failure, cut out, DVT, nerve and blood vessel injury, even the possibility anesthetic complications.  Patient agrees to proceed with surgery and consent will be obtained.  We will plan for surgery later this morning.  Shona Needles, MD [facility] [phone] (phone) [phone] (office) [email or web address]

## 2019-09-07 NOTE — Anesthesia Preprocedure Evaluation (Addendum)
Anesthesia Evaluation  Patient identified by MRN, date of birth, ID band Patient awake    Reviewed: Allergy & Precautions, NPO status , Patient's Chart, lab work & pertinent test results  Airway Mallampati: II  TM Distance: >3 FB Neck ROM: Full    Dental  (+) Dental Advisory Given, Edentulous Upper, Edentulous Lower   Pulmonary Current Smoker and Patient abstained from smoking.,    Pulmonary exam normal breath sounds clear to auscultation       Cardiovascular negative cardio ROS   Rhythm:Regular Rate:Tachycardia     Neuro/Psych negative neurological ROS  negative psych ROS   GI/Hepatic negative GI ROS, Neg liver ROS,   Endo/Other  negative endocrine ROS  Renal/GU negative Renal ROS     Musculoskeletal Left intertrochanteric femur fracture   Abdominal   Peds  Hematology negative hematology ROS (+)   Anesthesia Other Findings Day of surgery medications reviewed with the patient.  Reproductive/Obstetrics                            Anesthesia Physical Anesthesia Plan  ASA: III  Anesthesia Plan: Spinal   Post-op Pain Management:    Induction: Intravenous  PONV Risk Score and Plan: 2 and Propofol infusion and Treatment may vary due to age or medical condition  Airway Management Planned: Natural Airway and Nasal Cannula  Additional Equipment:   Intra-op Plan:   Post-operative Plan:   Informed Consent: I have reviewed the patients History and Physical, chart, labs and discussed the procedure including the risks, benefits and alternatives for the proposed anesthesia with the patient or authorized representative who has indicated his/her understanding and acceptance.     Dental advisory given  Plan Discussed with: CRNA  Anesthesia Plan Comments:        Anesthesia Quick Evaluation

## 2019-09-07 NOTE — Progress Notes (Signed)
Report called to Horris Latino, RN in short stay.

## 2019-09-07 NOTE — Care Management (Signed)
CM consult acknowledged to assist with any HH/DME needs. Awaiting PT/OT eval for DCP recommendations and will continue to follow.  Shakeyla Giebler RN, BSN, NCM-BC, ACM-RN [phone] 

## 2019-09-07 NOTE — Progress Notes (Signed)
Ortho Trauma Note  Reviewed case and imaging. Left intertrochanteric femur fracture. Plan for surgery tomorrow. NPO past midnight. Formal consult to follow in AM.  Shona Needles, MD [facility] [phone] (phone) [phone] (office) [email or web address]

## 2019-09-08 ENCOUNTER — Other Ambulatory Visit: Payer: Self-pay

## 2019-09-08 ENCOUNTER — Encounter (HOSPITAL_COMMUNITY): Payer: Self-pay | Admitting: General Practice

## 2019-09-08 DIAGNOSIS — S72002D Fracture of unspecified part of neck of left femur, subsequent encounter for closed fracture with routine healing: Secondary | ICD-10-CM

## 2019-09-08 DIAGNOSIS — W19XXXD Unspecified fall, subsequent encounter: Secondary | ICD-10-CM

## 2019-09-08 DIAGNOSIS — E559 Vitamin D deficiency, unspecified: Secondary | ICD-10-CM

## 2019-09-08 DIAGNOSIS — W19XXXA Unspecified fall, initial encounter: Secondary | ICD-10-CM

## 2019-09-08 LAB — CBC
HCT: 35 % — ABNORMAL LOW (ref 36.0–46.0)
Hemoglobin: 12.6 g/dL (ref 12.0–15.0)
MCH: 31.8 pg (ref 26.0–34.0)
MCHC: 36 g/dL (ref 30.0–36.0)
MCV: 88.4 fL (ref 80.0–100.0)
Platelets: 218 10*3/uL (ref 150–400)
RBC: 3.96 MIL/uL (ref 3.87–5.11)
RDW: 12.8 % (ref 11.5–15.5)
WBC: 9.5 10*3/uL (ref 4.0–10.5)
nRBC: 0 % (ref 0.0–0.2)

## 2019-09-08 LAB — COMPREHENSIVE METABOLIC PANEL
ALT: 12 U/L (ref 0–44)
AST: 18 U/L (ref 15–41)
Albumin: 3.2 g/dL — ABNORMAL LOW (ref 3.5–5.0)
Alkaline Phosphatase: 52 U/L (ref 38–126)
Anion gap: 9 (ref 5–15)
BUN: 12 mg/dL (ref 8–23)
CO2: 24 mmol/L (ref 22–32)
Calcium: 9 mg/dL (ref 8.9–10.3)
Chloride: 100 mmol/L (ref 98–111)
Creatinine, Ser: 0.54 mg/dL (ref 0.44–1.00)
GFR calc Af Amer: >60 mL/min (ref >60)
GFR calc non Af Amer: >60 mL/min (ref >60)
Glucose, Bld: 118 mg/dL — ABNORMAL HIGH (ref 70–99)
Potassium: 4.6 mmol/L (ref 3.5–5.1)
Sodium: 133 mmol/L — ABNORMAL LOW (ref 135–145)
Total Bilirubin: 0.7 mg/dL (ref 0.3–1.2)
Total Protein: 5.9 g/dL — ABNORMAL LOW (ref 6.5–8.1)

## 2019-09-08 LAB — HEMOGLOBIN A1C
Hgb A1c MFr Bld: 5.5 % (ref 4.8–5.6)
Mean Plasma Glucose: 111.15 mg/dL

## 2019-09-08 LAB — PHOSPHORUS: Phosphorus: 3.5 mg/dL (ref 2.5–4.6)

## 2019-09-08 MED ORDER — VITAMIN D 25 MCG (1000 UNIT) PO TABS
2000.0000 [IU] | ORAL_TABLET | Freq: Two times a day (BID) | ORAL | Status: DC
  Administered 2019-09-08 – 2019-09-09 (×3): 2000 [IU] via ORAL
  Filled 2019-09-08 (×3): qty 2

## 2019-09-08 MED ORDER — NICOTINE 21 MG/24HR TD PT24
21.0000 mg | MEDICATED_PATCH | Freq: Every day | TRANSDERMAL | Status: DC
  Administered 2019-09-08 – 2019-09-09 (×2): 21 mg via TRANSDERMAL
  Filled 2019-09-08 (×2): qty 1

## 2019-09-08 MED ORDER — ONDANSETRON HCL 4 MG/2ML IJ SOLN
4.0000 mg | Freq: Four times a day (QID) | INTRAMUSCULAR | Status: DC | PRN
  Administered 2019-09-09: 4 mg via INTRAVENOUS
  Filled 2019-09-08: qty 2

## 2019-09-08 NOTE — Evaluation (Signed)
Physical Therapy Evaluation Patient Details Name: Annette Berry MRN: [id number] DOB: 1941/03/27 Today's Date: 09/08/2019   History of Present Illness  78yo female presenting after a fall at home, found to have L hip fracture on x-ray. Received L IM nail placed 10/12, now WBAT. No significant PMH  Clinical Impression   Patient received in bed, HOH and very pleasant, reports she wants to go home when able. Required MinA for bed mobility, MinA of 1/Min guard of 1 for safety with sit to stand with RW, and able to gait train approximately 67ft with RW and min guard x2 for safety however did require one time MaxA due to LOB, also MaxA to lower softly to chair. She was positioned to comfort in chair, nurse tech aware of mobility status, chair alarm active. She will continue to benefit from skilled PT services in the acute setting, currently recommending SNF and 24/7 assist moving forward.     Follow Up Recommendations SNF;Supervision/Assistance - 24 hour    Equipment Recommendations  Rolling walker with 5" wheels;3in1 (PT)    Recommendations for Other Services       Precautions / Restrictions Precautions Precautions: Fall Restrictions Weight Bearing Restrictions: Yes LLE Weight Bearing: Weight bearing as tolerated      Mobility  Bed Mobility Overal bed mobility: Needs Assistance Bed Mobility: Supine to Sit     Supine to sit: Min assist     General bed mobility comments: MinA for L LE management  Transfers Overall transfer level: Needs assistance Equipment used: Rolling walker (2 wheeled) Transfers: Sit to/from Stand Sit to Stand: Min assist;Min guard         General transfer comment: MinA for boost to stand/min guard x1 for safety, cues for hand placement and sequencing  Ambulation/Gait Ambulation/Gait assistance: Min guard Gait Distance (Feet): 3 Feet Assistive device: Rolling walker (2 wheeled) Gait Pattern/deviations: Step-to pattern;Decreased step length -  right;Decreased stance time - left;Decreased weight shift to left;Antalgic;Trunk flexed Gait velocity: decreased   General Gait Details: antalgic step to pattern, cues for step through pattern and increased WB L LE, patient fatigues very easily and had one time need for MaxAx1 to maintain balance due to fatigue/LOB. Cues for safety backing into chair, MaxA for controlled lower  Stairs            Wheelchair Mobility    Modified Rankin (Stroke Patients Only)       Balance Overall balance assessment: Needs assistance Sitting-balance support: Bilateral upper extremity supported;Feet supported Sitting balance-Leahy Scale: Good     Standing balance support: Bilateral upper extremity supported;During functional activity Standing balance-Leahy Scale: Poor Standing balance comment: fatigues easily, reliant on B UE support                             Pertinent Vitals/Pain Pain Assessment: Faces Faces Pain Scale: Hurts little more Pain Location: L LE with movement Pain Descriptors / Indicators: Aching;Discomfort Pain Intervention(s): Limited activity within patient's tolerance;Monitored during session;Ice applied    Home Living Family/patient expects to be discharged to:: Private residence Living Arrangements: Alone Available Help at Discharge: Neighbor;Available PRN/intermittently;Family Type of Home: House Home Access: Stairs to enter Entrance Stairs-Rails: Right Entrance Stairs-Number of Steps: having a ramp built now, not finished yet; 4 steps with railing right side Home Layout: One level Home Equipment: Cane - quad Additional Comments: son has a walker she can use at home    Prior Function Level of Independence: Independent  Hand Dominance   Dominant Hand: Right    Extremity/Trunk Assessment   Upper Extremity Assessment Upper Extremity Assessment: Defer to OT evaluation    Lower Extremity Assessment Lower Extremity Assessment:  Generalized weakness    Cervical / Trunk Assessment Cervical / Trunk Assessment: Kyphotic  Communication   Communication: HOH  Cognition Arousal/Alertness: Awake/alert Behavior During Therapy: WFL for tasks assessed/performed;Anxious Overall Cognitive Status: Within Functional Limits for tasks assessed                                 General Comments: HOH      General Comments General comments (skin integrity, edema, etc.): resistant to idea of SNF    Exercises     Assessment/Plan    PT Assessment Patient needs continued PT services  PT Problem List Decreased strength;Decreased knowledge of use of DME;Decreased activity tolerance;Decreased safety awareness;Decreased balance;Decreased knowledge of precautions;Pain;Decreased mobility;Decreased coordination       PT Treatment Interventions DME instruction;Balance training;Gait training;Neuromuscular re-education;Stair training;Functional mobility training;Patient/family education;Therapeutic activities;Therapeutic exercise    PT Goals (Current goals can be found in the Care Plan section)  Acute Rehab PT Goals Patient Stated Goal: to go home PT Goal Formulation: With patient Time For Goal Achievement: 09/22/19 Potential to Achieve Goals: Good    Frequency Min 3X/week   Barriers to discharge Decreased caregiver support has family and neighbors that can be in intermittently, unsure if she would have true 24/7 assist/S    Co-evaluation               AM-PAC PT "6 Clicks" Mobility  Outcome Measure Help needed turning from your back to your side while in a flat bed without using bedrails?: A Little Help needed moving from lying on your back to sitting on the side of a flat bed without using bedrails?: A Little Help needed moving to and from a bed to a chair (including a wheelchair)?: A Lot Help needed standing up from a chair using your arms (e.g., wheelchair or bedside chair)?: A Little Help needed to  walk in hospital room?: A Lot Help needed climbing 3-5 steps with a railing? : A Lot 6 Click Score: 15    End of Session Equipment Utilized During Treatment: Gait belt Activity Tolerance: Patient limited by pain;Patient limited by fatigue Patient left: in chair;with call bell/phone within reach;with chair alarm set Nurse Communication: Mobility status PT Visit Diagnosis: Unsteadiness on feet (R26.81);Difficulty in walking, not elsewhere classified (R26.2);Pain;Muscle weakness (generalized) (M62.81);History of falling (Z91.81) Pain - Right/Left: Left Pain - part of body: Leg    Time: 1102-1130 PT Time Calculation (min) (ACUTE ONLY): 28 min   Charges:   PT Evaluation $PT Eval Low Complexity: 1 Low PT Treatments $Gait Training: 8-22 mins        Deniece Ree PT, DPT, CBIS  Supplemental Physical Therapist [place]    Pager [phone] Acute Rehab Office [phone]

## 2019-09-08 NOTE — Progress Notes (Addendum)
PROGRESS NOTE    Annette Berry  GQQ:[id number] DOB: [birth year] DOA: 09/06/2019 PCP: Leonard Downing, MD   Brief Narrative:  78 y.o. female with no significant past medical history had a fall at home while standing on a couch to adjust the window blinds.  Patient fell onto the floor, did not hit her head or lose consciousness.  Denied any chest pain, shortness of breath, dizziness or lightheadedness prior to the fall.  Was brought to the ER where x-rays revealed left hip fracture.  On-call orthopedic surgeon Dr. Doreatha Martin was consulted, admission recommended.  Labs revealed mild leukocytosis WBC count 11.4 glucose 125 creatinine 0.7 hemoglobin 14.9 platelets 298 EKG shows normal sinus rhythm with biatrial enlargement.  Chest x-ray is normal.  Patient admitted for left hip fracture secondary to mechanical fall.  She underwent cephalomedullary nailing of left intertrochanteric femur fracture the following morning.  Patient working with PT today.  Of note, patient does have vitamin D deficiency, started on supplementation.     Assessment & Plan:   Principal Problem:   Closed left hip fracture (HCC) Active Problems:   Vitamin D deficiency   Closed left hip fracture as a results of mechanical fall, status post surgical repair with Dr. Doreatha Martin.  Working with PT. - pain control - antiemetics PRN - expect will need rehab   Vitamin D Deficiency - started on vitamin D3 supplement - continue that on discharge - recommend DEXA if not done, suspect osteoporosis  Tobacco abuse - tobacco cessation counseling Smokes 2 ppd. Suspect she has COPD as she has very poor air movement. - nicotine patch      DVT prophylaxis: Lovenox Code Status:   Code Status: Full Code  Family Communication: no family at bedside  Disposition Plan: rehab in 1-2 days   Consultants:    Orthopedics, Dr. Doreatha Martin  Procedures:  cephalomedullary nailing of left intertrochanteric femur fracture on 09/07/19  with Dr. Doreatha Martin  Antimicrobials:   Perioperative Ancef   Subjective: Patient up in chair working with PT.  States pain fairly well-controlled.  Reports nausea with pain medication.  Denies fever chills, chest pain or SOB.  No other acute issues.  Objective: Vitals:   09/08/19 0411 09/08/19 0806 09/08/19 1234 09/08/19 2026  BP: (!) 149/61 (!) 156/66 130/89 (!) 158/64  Pulse: 90 70 90 92  Resp: 17 18 18 13   Temp: 98 F (36.7 C) 98.5 F (36.9 C) 98.2 F (36.8 C) 99 F (37.2 C)  TempSrc: Oral Oral Oral Oral  SpO2: 95% 94% 93% 94%  Weight:      Height:        Intake/Output Summary (Last 24 hours) at 09/08/2019 2241 Last data filed at 09/08/2019 1749 Gross per 24 hour  Intake 720.32 ml  Output 700 ml  Net 20.32 ml   Filed Weights   09/06/19 2221  Weight: 54.4 kg    Examination:  General exam: awake and alert, no acute distress  HEENT: hard of hearing, atraumatic, clear conjunctiva Respiratory system: poor air movement, clear to auscultation. Respiratory effort normal. Cardiovascular system: S1 & S2 heard, RRR. No JVD, murmurs, rubs, gallops or clicks. No pedal edema. Gastrointestinal system: Abdomen is nondistended, soft and nontender.  Central nervous system: Alert and oriented. Normal speech. No gross focal neurological deficits. Extremities: left lateral thigh with dressings in place, postop ecchymosis noted. Skin: No rashes, lesions or ulcers Psychiatry: normal mood, congruent affect  Data Reviewed: I have personally reviewed following labs and imaging studies  CBC:  Recent Labs  Lab 09/06/19 2250 09/07/19 0514 09/08/19 0316  WBC 11.4* 10.8* 9.5  NEUTROABS 9.2* 9.1*  --   HGB 14.9 13.2 12.6  HCT 43.5 38.7 35.0*  MCV 91.4 89.0 88.4  PLT 298 260 218   Basic Metabolic Panel: Recent Labs  Lab 09/06/19 2250 09/07/19 0514 09/08/19 0316  NA 135 134* 133*  K 4.3 4.1 4.6  CL 101 100 100  CO2 22 24 24   GLUCOSE 125* 146* 118*  BUN 16 14 12   CREATININE  0.73 0.69 0.54  CALCIUM 9.4 8.9 9.0  PHOS  --   --  3.5   GFR: Estimated Creatinine Clearance: 50.6 mL/min (by C-G formula based on SCr of 0.54 mg/dL). Liver Function Tests: Recent Labs  Lab 09/06/19 2250 09/07/19 0514 09/08/19 0316  AST 19 18 18   ALT 13 14 12   ALKPHOS 63 59 52  BILITOT 0.2* 0.7 0.7  PROT 6.8 6.3* 5.9*  ALBUMIN 4.0 3.7 3.2*   No results for input(s): LIPASE, AMYLASE in the last 168 hours. No results for input(s): AMMONIA in the last 168 hours. Coagulation Profile: Recent Labs  Lab 09/06/19 2250  INR 1.0   Cardiac Enzymes: No results for input(s): CKTOTAL, CKMB, CKMBINDEX, TROPONINI in the last 168 hours. BNP (last 3 results) No results for input(s): PROBNP in the last 8760 hours. HbA1C: Recent Labs    09/08/19 0316  HGBA1C 5.5   CBG: No results for input(s): GLUCAP in the last 168 hours. Lipid Profile: No results for input(s): CHOL, HDL, LDLCALC, TRIG, CHOLHDL, LDLDIRECT in the last 72 hours. Thyroid Function Tests: No results for input(s): TSH, T4TOTAL, FREET4, T3FREE, THYROIDAB in the last 72 hours. Anemia Panel: No results for input(s): VITAMINB12, FOLATE, FERRITIN, TIBC, IRON, RETICCTPCT in the last 72 hours. Sepsis Labs: No results for input(s): PROCALCITON, LATICACIDVEN in the last 168 hours.  Recent Results (from the past 240 hour(s))  SARS CORONAVIRUS 2 (TAT 6-24 HRS) Nasopharyngeal Nasopharyngeal Swab     Status: None   Collection Time: 09/06/19 11:39 PM   Specimen: Nasopharyngeal Swab  Result Value Ref Range Status   SARS Coronavirus 2 NEGATIVE NEGATIVE Final    Comment: (NOTE) SARS-CoV-2 target nucleic acids are NOT DETECTED. The SARS-CoV-2 RNA is generally detectable in upper and lower respiratory specimens during the acute phase of infection. Negative results do not preclude SARS-CoV-2 infection, do not rule out co-infections with other pathogens, and should not be used as the sole basis for treatment or other patient  management decisions. Negative results must be combined with clinical observations, patient history, and epidemiological information. The expected result is Negative. Fact Sheet for Patients: Fact Sheet for Healthcare Providers: 11/06/19 This test is not yet approved or cleared by the 09/10/19 FDA and  has been authorized for detection and/or diagnosis of SARS-CoV-2 by FDA under an Emergency Use Authorization (EUA). This EUA will remain  in effect (meaning this test can be used) for the duration of the COVID-19 declaration under Section 56 4(b)(1) of the Act, 21 U.S.C. section 360bbb-3(b)(1), unless the authorization is terminated or revoked sooner. Performed at [facility], [address], [place], [place] [facility]   Surgical PCR screen     Status: None   Collection Time: 09/07/19  3:39 AM   Specimen: Nasal Mucosa; Nasal Swab  Result Value Ref Range Status   MRSA, PCR NEGATIVE NEGATIVE Final   Staphylococcus aureus NEGATIVE NEGATIVE Final    Comment: (NOTE) The Xpert SA Assay (  FDA approved for NASAL specimens in patients 78 years of age and older), is one component of a comprehensive surveillance program. It is not intended to diagnose infection nor to guide or monitor treatment. Performed at [facility], [address] [place], [place] [address]          Radiology Studies: Dg Chest 1 View  Result Date: 09/06/2019 CLINICAL DATA:  Hip fracture EXAM: CHEST  1 VIEW COMPARISON:  None. FINDINGS: No focal opacity or pleural effusion. Normal cardiomediastinal silhouette with aortic atherosclerosis. No pneumothorax. IMPRESSION: No active disease. Electronically Signed   By: Jasmine PangKim  Fujinaga M.D.   On: 09/06/2019 23:13   Dg C-arm 1-60 Min  Result Date: 09/07/2019 CLINICAL DATA:  Fracture repair FLUOROSCOPY TIME:  1 minutes 23 seconds. Images: 5 EXAM: LEFT FEMUR 2 VIEWS; DG C-ARM 1-60  MIN COMPARISON:  None. FINDINGS: The left hip fracture is repair throughout the study with placement of 2 gamma nails and an intramedullary rod affixed distally with an interlocking screw. IMPRESSION: Left hip fracture repair as above. Electronically Signed   By: Gerome Samavid  Williams III M.D   On: 09/07/2019 10:28   Dg Hip Unilat W Or W/o Pelvis 2-3 Views Left  Result Date: 09/07/2019 CLINICAL DATA:  Repair of hip fracture. EXAM: DG HIP (WITH OR WITHOUT PELVIS) 2-3V LEFT COMPARISON:  September 07, 2019 FINDINGS: The patient is status post left hip fracture repair. Hardware including 2 gamma nails, an intramedullary rod, and a distal interlocking screw are in good position. IMPRESSION: Left hip fracture repair as above. Electronically Signed   By: Gerome Samavid  Williams III M.D   On: 09/07/2019 11:58   Dg Hip Unilat With Pelvis 2-3 Views Left  Result Date: 09/06/2019 CLINICAL DATA:  Fall EXAM: DG HIP (WITH OR WITHOUT PELVIS) 2-3V LEFT COMPARISON:  None. FINDINGS: SI joints are non widened. Possible left sacral ala fracture. Pubic symphysis and rami appear intact. Both femoral heads project in joint. Acute mildly impacted left intertrochanteric fracture. Vascular calcifications. IMPRESSION: 1. Acute mildly impacted left intertrochanteric fracture 2. Possible left sacral ala fracture. Electronically Signed   By: Jasmine PangKim  Fujinaga M.D.   On: 09/06/2019 23:12   Dg Femur Min 2 Views Left  Result Date: 09/07/2019 CLINICAL DATA:  Fracture repair FLUOROSCOPY TIME:  1 minutes 23 seconds. Images: 5 EXAM: LEFT FEMUR 2 VIEWS; DG C-ARM 1-60 MIN COMPARISON:  None. FINDINGS: The left hip fracture is repair throughout the study with placement of 2 gamma nails and an intramedullary rod affixed distally with an interlocking screw. IMPRESSION: Left hip fracture repair as above. Electronically Signed   By: Gerome Samavid  Williams III M.D   On: 09/07/2019 10:28        Scheduled Meds: . acetaminophen  1,000 mg Oral Q6H  . cholecalciferol   2,000 Units Oral BID  . enoxaparin (LOVENOX) injection  40 mg Subcutaneous Q24H  . feeding supplement (ENSURE ENLIVE)  237 mL Oral BID BM  . mupirocin ointment  1 application Nasal BID  . nicotine  21 mg Transdermal Daily   Continuous Infusions: . lactated ringers 10 mL/hr at 09/08/19 1358     LOS: 1 day    Time spent: 30-35 min    Pennie BanterKelly A Anie Juniel, DO [facility] Pager [phone]  If 7PM-7AM, please contact night-coverage [email or web address] Password [email or web address] 09/08/2019, 10:41 PM

## 2019-09-08 NOTE — Evaluation (Signed)
Occupational Therapy Evaluation Patient Details Name: Annette Berry MRN: [id number] DOB: September 06, 1941 Today's Date: 09/08/2019    History of Present Illness 78yo female presenting after a fall at home, found to have L hip fracture on x-ray. Received L IM nail placed 10/12, now WBAT. No significant PMH   Clinical Impression   Pt with decline in function and safety with ADLs and ADL mobility with impaired strength, balance and endurance. Pt reports that PTA, she lived ta home alone with family nearby and was independent with ADL/selfcare, home mgt and mobility. Pr currently requires max - total A with LB ADLs, min - min guard A with mobility using RW, max A with toileting and min A with sit - stand. Pt adamantly refusing ST SNF for rehab and states that her family can assist her 24/7. Pt would benefit from acute OT services to address impairments to maximize level of function and safety    Follow Up Recommendations  SNF(pt refusung SNF and states that her family and daughter in law that doesn't work will assist her)    Equipment Recommendations  3 in 1 bedside commode;Tub/shower seat;Other (comment)(reacher)    Recommendations for Other Services       Precautions / Restrictions Precautions Precautions: Fall Restrictions Weight Bearing Restrictions: Yes LLE Weight Bearing: Weight bearing as tolerated      Mobility Bed Mobility Overal bed mobility: Needs Assistance Bed Mobility: Supine to Sit     Supine to sit: Min assist     General bed mobility comments: pt in recliner upon arrival  Transfers Overall transfer level: Needs assistance Equipment used: Rolling walker (2 wheeled) Transfers: Sit to/from Stand Sit to Stand: Min assist;Min guard         General transfer comment: MinA for boost to stand/min guard x1 for safety, cues for hand placement and sequencing    Balance Overall balance assessment: Needs assistance Sitting-balance support: Bilateral upper  extremity supported;Feet supported Sitting balance-Leahy Scale: Good     Standing balance support: Bilateral upper extremity supported;During functional activity Standing balance-Leahy Scale: Poor Standing balance comment: fatigues easily, reliant on B UE support                           ADL either performed or assessed with clinical judgement   ADL Overall ADL's : Needs assistance/impaired Eating/Feeding: Independent;Sitting   Grooming: Wash/dry hands;Wash/dry face;Minimal assistance;Standing   Upper Body Bathing: Set up;Sitting   Lower Body Bathing: Maximal assistance   Upper Body Dressing : Set up;Sitting   Lower Body Dressing: Total assistance   Toilet Transfer: Minimal assistance;Min guard;Ambulation;RW;BSC;Cueing for safety   Toileting- Clothing Manipulation and Hygiene: Maximal assistance;Sit to/from stand       Functional mobility during ADLs: Minimal assistance;Min guard;Cueing for safety;Rolling walker       Vision Baseline Vision/History: Wears glasses Patient Visual Report: No change from baseline       Perception     Praxis      Pertinent Vitals/Pain Pain Assessment: 0-10 Pain Score: 3  Faces Pain Scale: Hurts little more Pain Location: L LE with movement Pain Descriptors / Indicators: Aching;Discomfort Pain Intervention(s): Monitored during session;Repositioned;Ice applied     Hand Dominance Right   Extremity/Trunk Assessment Upper Extremity Assessment Upper Extremity Assessment: Generalized weakness   Lower Extremity Assessment Lower Extremity Assessment: Defer to PT evaluation   Cervical / Trunk Assessment Cervical / Trunk Assessment: Kyphotic   Communication Communication Communication: HOH   Cognition Arousal/Alertness: Awake/alert  Behavior During Therapy: [facility] for tasks assessed/performed;Anxious Overall Cognitive Status: Within Functional Limits for tasks assessed                                  General Comments: HOH   General Comments  resistant to idea of SNF    Exercises     Shoulder Instructions      Home Living Family/patient expects to be discharged to:: Private residence Living Arrangements: Alone Available Help at Discharge: Neighbor;Available PRN/intermittently;Family Type of Home: House Home Access: Stairs to enter [employer] of Steps: having a ramp built now, not finished yet; 4 steps with railing right side Entrance Stairs-Rails: Right Home Layout: One level     Bathroom Shower/Tub: Producer, television/film/video: Standard Bathroom Accessibility: Yes   Home Equipment: Cane - quad   Additional Comments: son has a walker she can use at home      Prior Functioning/Environment Level of Independence: Independent                 OT Problem List: Decreased strength;Impaired balance (sitting and/or standing);Decreased activity tolerance;Decreased knowledge of use of DME or AE;Pain      OT Treatment/Interventions: Self-care/ADL training;DME and/or AE instruction;Therapeutic activities;Therapeutic exercise;Patient/family education    OT Goals(Current goals can be found in the care plan section) Acute Rehab OT Goals Patient Stated Goal: to go home OT Goal Formulation: With patient Time For Goal Achievement: 09/22/19 Potential to Achieve Goals: Good ADL Goals Pt Will Perform Grooming: with min guard assist;with supervision;with set-up;standing Pt Will Perform Lower Body Bathing: with mod assist;with caregiver independent in assisting Pt Will Perform Lower Body Dressing: with max assist;with mod assist;with adaptive equipment;with caregiver independent in assisting Pt Will Transfer to Toilet: with min guard assist;with supervision;ambulating Pt Will Perform Toileting - Clothing Manipulation and hygiene: with mod assist;with min assist;sit to/from stand Pt Will Perform Tub/Shower Transfer: with min guard assist;ambulating;shower seat;3 in  1;rolling walker  OT Frequency: Min 2X/week   Barriers to D/C: Decreased caregiver support          Co-evaluation              AM-PAC OT "6 Clicks" Daily Activity     Outcome Measure Help from another person eating meals?: None Help from another person taking care of personal grooming?: A Little Help from another person toileting, which includes using toliet, bedpan, or urinal?: A Lot Help from another person bathing (including washing, rinsing, drying)?: A Lot Help from another person to put on and taking off regular upper body clothing?: A Little Help from another person to put on and taking off regular lower body clothing?: Total 6 Click Score: 15   End of Session Equipment Utilized During Treatment: Gait belt;Rolling walker;Other (comment)(BSC)  Activity Tolerance: Patient tolerated treatment well Patient left: in chair;with call bell/phone within reach;with chair alarm set  OT Visit Diagnosis: Unsteadiness on feet (R26.81);Other abnormalities of gait and mobility (R26.89);Muscle weakness (generalized) (M62.81);History of falling (Z91.81);Pain Pain - Right/Left: Left Pain - part of body: Hip                Time: 1131-1156 OT Time Calculation (min): 25 min Charges:  OT General Charges $OT Visit: 1 Visit OT Treatments $Self Care/Home Management : 8-22 mins    Galen Manila 09/08/2019, 12:55 PM

## 2019-09-08 NOTE — Progress Notes (Signed)
Pt c/o nausea right after taking tramadol and tylenol. Vomited whole pills. Documented in MAR and intake/output.

## 2019-09-08 NOTE — Progress Notes (Signed)
Orthopaedic Trauma Progress Note  S: Doing well this morning, pain well controlled. Has not been up with therapy yet. No other issues noted  O:  Vitals:   09/08/19 0411 09/08/19 0806  BP: (!) 149/61 (!) 156/66  Pulse: 90 70  Resp: 17 18  Temp: 98 F (36.7 C) 98.5 F (36.9 C)  SpO2: 95% 94%    General - Sitting up in bed, NAD Cardiac -  Heart regular rate and rhythm Respiratory -  No increased work of breathing.  Left Lower Extremity - Dressings clean, dry, intact. Tender with palpation of lateral thigh. Ankle dorsiflexion/plantarflexion intact. Tolerate small amount of passive knee flexion/ sensation intact distally. Neurovascularly intact  Imaging: Stable post op imaging.   Labs:  Results for orders placed or performed during the hospital encounter of 09/06/19 (from the past 24 hour(s))  VITAMIN D 25 Hydroxy (Vit-D Deficiency, Fractures)     Status: Abnormal   Collection Time: 09/07/19  1:28 PM  Result Value Ref Range   Vit D, 25-Hydroxy 19.44 (L) 30 - 100 ng/mL  CBC     Status: Abnormal   Collection Time: 09/08/19  3:16 AM  Result Value Ref Range   WBC 9.5 4.0 - 10.5 K/uL   RBC 3.96 3.87 - 5.11 MIL/uL   Hemoglobin 12.6 12.0 - 15.0 g/dL   HCT 35.0 (L) 36.0 - 46.0 %   MCV 88.4 80.0 - 100.0 fL   MCH 31.8 26.0 - 34.0 pg   MCHC 36.0 30.0 - 36.0 g/dL   RDW 12.8 11.5 - 15.5 %   Platelets 218 150 - 400 K/uL   nRBC 0.0 0.0 - 0.2 %  Comprehensive metabolic panel     Status: Abnormal   Collection Time: 09/08/19  3:16 AM  Result Value Ref Range   Sodium 133 (L) 135 - 145 mmol/L   Potassium 4.6 3.5 - 5.1 mmol/L   Chloride 100 98 - 111 mmol/L   CO2 24 22 - 32 mmol/L   Glucose, Bld 118 (H) 70 - 99 mg/dL   BUN 12 8 - 23 mg/dL   Creatinine, Ser 0.54 0.44 - 1.00 mg/dL   Calcium 9.0 8.9 - 10.3 mg/dL   Total Protein 5.9 (L) 6.5 - 8.1 g/dL   Albumin 3.2 (L) 3.5 - 5.0 g/dL   AST 18 15 - 41 U/L   ALT 12 0 - 44 U/L   Alkaline Phosphatase 52 38 - 126 U/L   Total Bilirubin 0.7 0.3 -  1.2 mg/dL   GFR calc non Af Amer >60 >60 mL/min   GFR calc Af Amer >60 >60 mL/min   Anion gap 9 5 - 15  Phosphorus     Status: None   Collection Time: 09/08/19  3:16 AM  Result Value Ref Range   Phosphorus 3.5 2.5 - 4.6 mg/dL  Hemoglobin A1c     Status: None   Collection Time: 09/08/19  3:16 AM  Result Value Ref Range   Hgb A1c MFr Bld 5.5 4.8 - 5.6 %   Mean Plasma Glucose 111.15 mg/dL    Assessment: 78 year old female s/p fall  Injuries: Left intertrochanteric femur fracture s/p cephalomedullary nailing  Weightbearing: WBAT LLE  Insicional and dressing care: Will remove dressings tomorrow and leave open to air  Orthopedic device(s): none  CV/Blood loss: Hgb 12.6 this AM. Hemodynamically stable  Pain management:  1. Tylenol 1000 mg q 6 hours scheduled 2.Tramadol 50 mg q 6 hours PRN 3. Morphine 0.5-1 mg q  2 hours PRN  VTE prophylaxis: Lovenox starting today  ID:  Ancef 2gm post op  Foley/Lines:  No foley, KVO IVFs  Impediments to Fracture Healing: Vitamin D deficiency, started on D3 supplement  Dispo: PT/OT eval today, dispo pending. Would recommend continuing 5,000 IU vitamin D3 supplement at discharge  Follow - up plan: 2 weeks    Nashya Garlington A. Ladonna Sn[facility] ?[phone]? (phone)

## 2019-09-08 NOTE — Progress Notes (Signed)
Text paged Dr. Arbutus Ped about nicotine patch for patient. Patient smokes 2 packs a day.

## 2019-09-09 ENCOUNTER — Encounter (HOSPITAL_COMMUNITY): Payer: Self-pay | Admitting: Student

## 2019-09-09 LAB — BASIC METABOLIC PANEL
Anion gap: 10 (ref 5–15)
BUN: 10 mg/dL (ref 8–23)
CO2: 24 mmol/L (ref 22–32)
Calcium: 8.8 mg/dL — ABNORMAL LOW (ref 8.9–10.3)
Chloride: 97 mmol/L — ABNORMAL LOW (ref 98–111)
Creatinine, Ser: 0.43 mg/dL — ABNORMAL LOW (ref 0.44–1.00)
GFR calc Af Amer: >60 mL/min (ref >60)
GFR calc non Af Amer: >60 mL/min (ref >60)
Glucose, Bld: 120 mg/dL — ABNORMAL HIGH (ref 70–99)
Potassium: 4.1 mmol/L (ref 3.5–5.1)
Sodium: 131 mmol/L — ABNORMAL LOW (ref 135–145)

## 2019-09-09 LAB — CBC
HCT: 34.5 % — ABNORMAL LOW (ref 36.0–46.0)
Hemoglobin: 12.3 g/dL (ref 12.0–15.0)
MCH: 31.2 pg (ref 26.0–34.0)
MCHC: 35.7 g/dL (ref 30.0–36.0)
MCV: 87.6 fL (ref 80.0–100.0)
Platelets: 213 10*3/uL (ref 150–400)
RBC: 3.94 MIL/uL (ref 3.87–5.11)
RDW: 12.6 % (ref 11.5–15.5)
WBC: 10.2 10*3/uL (ref 4.0–10.5)
nRBC: 0 % (ref 0.0–0.2)

## 2019-09-09 LAB — PTH, INTACT AND CALCIUM
Calcium, Total (PTH): 9 mg/dL (ref 8.7–10.3)
PTH: 22 pg/mL (ref 15–65)

## 2019-09-09 MED ORDER — NICOTINE 21 MG/24HR TD PT24: 21.0000 mg | MEDICATED_PATCH | Freq: Every day | TRANSDERMAL | 0 refills | Status: DC

## 2019-09-09 MED ORDER — VITAMIN D3 25 MCG PO TABS: 2000.0000 [IU] | ORAL_TABLET | Freq: Two times a day (BID) | ORAL | 0 refills | Status: AC

## 2019-09-09 MED ORDER — TRAMADOL HCL 50 MG PO TABS: 50.0000 mg | ORAL_TABLET | Freq: Four times a day (QID) | ORAL | 0 refills | Status: DC | PRN

## 2019-09-09 MED ORDER — ENSURE ENLIVE PO LIQD: 237.0000 mL | Freq: Two times a day (BID) | ORAL | 12 refills | Status: AC

## 2019-09-09 MED ORDER — POLYETHYLENE GLYCOL 3350 17 GM/SCOOP PO POWD: 1.0000 | Freq: Every day | ORAL | 0 refills | Status: AC

## 2019-09-09 MED ORDER — ONDANSETRON HCL 4 MG PO TABS: 4.0000 mg | ORAL_TABLET | Freq: Every day | ORAL | 0 refills | Status: AC | PRN

## 2019-09-09 NOTE — Care Management (Cosign Needed Addendum)
Patient suffers from a closed left hip fracture which impairs their ability to perform daily activities like toileting, feeding, dressing, grooming, bathing in the home.  A cane, walker, crutch will not resolve  issue with performing activities of daily living. A wheelchair will allow patient to safely perform daily activities. Patient is not able to propel themselves in the home using a standard weight wheelchair due to arm weakness, general weakness, endurance. Patient can self propel in the lightweight wheelchair. Length of need: Lifetime

## 2019-09-09 NOTE — Discharge Summary (Signed)
Physician Discharge Summary  Annette Berry MWN:[id number] DOB: Apr 14, 1941 DOA: 09/06/2019  PCP: Kaleen Mask, MD  Admit date: 09/06/2019 Discharge date: 09/09/2019  Recommendations for Outpatient Follow-up:  1. Follow up with PCP in 7-10 days. 2. Follow up with Orthopedic surgery as directed. 3. Discharge to home with 24 hours supervision. 4. Home Health PT/OT/Home health Aide.  Follow-up Information    [facility] Follow up.   Why: wheelchair, rolling walker, bedside commode Contact information: [address] [place] [address] [phone]        [facility] Follow up.   Specialty: Home Health Services Why: Home health physical and occupational therapy and home health aide Contact information: [address] [place] [address] [phone]            Discharge Diagnoses: Principal diagnosis is #1 1. Left intertrochanteric hip fracture 2. Vitamin D deficienc6y 3. Tobacco abuse  Discharge Condition: Fair Disposition: Home with home health and 24 hours supervision.  Diet recommendation: Regular  Filed Weights   09/06/19 2221  Weight: 54.4 kg    History of present illness:  HPI: Annette Berry is a 78 y.o. female with no significant past medical history had a fall at home while patient was working on the window blinds.  Patient fell onto the floor did not hit her head or lose consciousness did not have any chest pain or shortness of breath.  Was brought to the ER.  ED Course: In the ER x-rays revealed left hip fracture.  On-call orthopedic surgeon Dr. Jena Gauss has been consulted.  Patient admitted for hip fracture.  Labs revealed mild leukocytosis WBC count 11.4 glucose 125 creatinine 0.7 hemoglobin 14.9 platelets 298 EKG shows normal sinus rhythm with biatrial enlargement.  Chest x-ray is normal.  Patient admitted for left hip fracture secondary to mechanical fall.   Hospital Course:  78  y.o.femalewithno significant past medical history had a fall at home while standing on a couch to adjust the window blinds. Patient fell onto the floor, did not hit her head or lose consciousness.  Denied any chest pain, shortness of breath, dizziness or lightheadedness prior to the fall. Was brought to the ER where x-rays revealed left hip fracture. On-call orthopedic surgeon Dr. Jena Gauss was consulted, admission recommended. Labs revealed mild leukocytosis WBC count 11.4 glucose 125 creatinine 0.7 hemoglobin 14.9 platelets 298 EKG shows normal sinus rhythm with biatrial enlargement. Chest x-ray is normal. Patient admitted for left hip fracture secondary to mechanical fall.  She underwent cephalomedullary nailing of left intertrochanteric femur fracture the following morning.  Patient working with PT today.  Of note, patient does have vitamin D deficiency, started on supplementation.  The patient has been cleared for discharge to home with home health and 24 hour supervision today. She will go home with wheelchair, walker, and BSC as well as home health PT/OT and home health aide.  Today's assessment: S: The patient is resting comfortably. No new complaints. O: Vitals:  Vitals:   09/09/19 0819 09/09/19 1333  BP: (!) 163/60 121/67  Pulse: 91 92  Resp: 16 14  Temp: 98.9 F (37.2 C) 98.1 F (36.7 C)  SpO2: 96% 97%    Exam:  Constitutional:   The patient is awake, alert, and oriented x 3. No acute distress. Respiratory:   No increased work of breathing.  No wheezes, rales, or rhonchi  No tactile fremitus Cardiovascular:   Regular rate and rhythm  No murmurs, ectopy, or gallups.  No lateral PMI. No thrills. Abdomen:   Abdomen is soft, non-tender, non-distended  No hernias, masses, or organomegaly  Normoactive bowel sounds.  Musculoskeletal:   No cyanosis, clubbing, or edema Skin:   No rashes, lesions, ulcers  palpation of skin: no induration or  nodules Neurologic:   CN 2-12 intact  Sensation all 4 extremities intact Psychiatric:   Mental status o Mood, affect appropriate o Orientation to person, place, time   judgment and insight appear intact  Discharge Instructions  Discharge Instructions    Activity as tolerated - No restrictions   Complete by: As directed    Call MD for:  redness, tenderness, or signs of infection (pain, swelling, redness, odor or green/yellow discharge around incision site)   Complete by: As directed    Call MD for:  severe uncontrolled pain   Complete by: As directed    Diet - low sodium heart healthy   Complete by: As directed    Discharge instructions   Complete by: As directed    Follow up with PCP in 7-10 days. Follow up with orthopedic surgery as directed. Discharge to home with 24 hours supervision.   Increase activity slowly   Complete by: As directed      Allergies as of 09/09/2019   No Known Allergies     Medication List    TAKE these medications   acetaminophen 650 MG CR tablet Commonly known as: TYLENOL Take 1,300 mg by mouth 2 (two) times daily.   brimonidine 0.15 % ophthalmic solution Commonly known as: ALPHAGAN Place 1 drop into both eyes 2 (two) times daily.   feeding supplement (ENSURE ENLIVE) Liqd Take 237 mLs by mouth 2 (two) times daily between meals. Start taking on: September 10, 2019   latanoprost 0.005 % ophthalmic solution Commonly known as: XALATAN Place 1 drop into both eyes at bedtime.   nicotine 21 mg/24hr patch Commonly known as: NICODERM CQ - dosed in mg/24 hours Place 1 patch (21 mg total) onto the skin daily. Start taking on: September 10, 2019   ondansetron 4 MG tablet Commonly known as: Zofran Take 1 tablet (4 mg total) by mouth daily as needed for nausea or vomiting.   polyethylene glycol powder 17 GM/SCOOP powder Commonly known as: MiraLax Take 255 g by mouth daily.   traMADol 50 MG tablet Commonly known as: ULTRAM Take 1 tablet  (50 mg total) by mouth every 6 (six) hours as needed for moderate pain.   Vitamin D3 25 MCG tablet Commonly known as: Vitamin D Take 2 tablets (2,000 Units total) by mouth 2 (two) times daily.            Durable Medical Equipment  (From admission, onward)         Start     Ordered   09/09/19 1142  For home use only DME lightweight manual wheelchair with seat cushion  Once    Comments: Patient suffers from a closed left hip fracture which impairs their ability to perform daily activities like toileting, feeding, dressing, grooming, bathing in the home.  A cane, walker, crutch will not resolve  issue with performing activities of daily living. A wheelchair will allow patient to safely perform daily activities. Patient is not able to propel themselves in the home using a standard weight wheelchair due to arm weakness, general weakness, endurance. Patient can self propel in the lightweight wheelchair.  Length of need: Lifetime 16 inch Accessories: elevating leg rests (ELRs), wheel locks, extensions and anti-tippers.  09/09/19 1142   09/09/19 0905  For home use only DME Walker rolling  Once    Question:  Patient needs a walker to treat with the following condition  Answer:  Physical deconditioning   09/09/19 0904   09/09/19 0904  For home use only DME Bedside commode  Once    Question:  Patient needs a bedside commode to treat with the following condition  Answer:  Physical deconditioning   09/09/19 0904         No Known Allergies  The results of significant diagnostics from this hospitalization (including imaging, microbiology, ancillary and laboratory) are listed below for reference.    Significant Diagnostic Studies: Dg Chest 1 View  Result Date: 09/06/2019 CLINICAL DATA:  Hip fracture EXAM: CHEST  1 VIEW COMPARISON:  None. FINDINGS: No focal opacity or pleural effusion. Normal cardiomediastinal silhouette with aortic atherosclerosis. No pneumothorax. IMPRESSION: No active  disease. Electronically Signed   By: Jasmine PangKim  Fujinaga M.D.   On: 09/06/2019 23:13   Dg C-arm 1-60 Min  Result Date: 09/07/2019 CLINICAL DATA:  Fracture repair FLUOROSCOPY TIME:  1 minutes 23 seconds. Images: 5 EXAM: LEFT FEMUR 2 VIEWS; DG C-ARM 1-60 MIN COMPARISON:  None. FINDINGS: The left hip fracture is repair throughout the study with placement of 2 gamma nails and an intramedullary rod affixed distally with an interlocking screw. IMPRESSION: Left hip fracture repair as above. Electronically Signed   By: Gerome Samavid  Williams III M.D   On: 09/07/2019 10:28   Dg Hip Unilat W Or W/o Pelvis 2-3 Views Left  Result Date: 09/07/2019 CLINICAL DATA:  Repair of hip fracture. EXAM: DG HIP (WITH OR WITHOUT PELVIS) 2-3V LEFT COMPARISON:  September 07, 2019 FINDINGS: The patient is status post left hip fracture repair. Hardware including 2 gamma nails, an intramedullary rod, and a distal interlocking screw are in good position. IMPRESSION: Left hip fracture repair as above. Electronically Signed   By: Gerome Samavid  Williams III M.D   On: 09/07/2019 11:58   Dg Hip Unilat With Pelvis 2-3 Views Left  Result Date: 09/06/2019 CLINICAL DATA:  Fall EXAM: DG HIP (WITH OR WITHOUT PELVIS) 2-3V LEFT COMPARISON:  None. FINDINGS: SI joints are non widened. Possible left sacral ala fracture. Pubic symphysis and rami appear intact. Both femoral heads project in joint. Acute mildly impacted left intertrochanteric fracture. Vascular calcifications. IMPRESSION: 1. Acute mildly impacted left intertrochanteric fracture 2. Possible left sacral ala fracture. Electronically Signed   By: Jasmine PangKim  Fujinaga M.D.   On: 09/06/2019 23:12   Dg Femur Min 2 Views Left  Result Date: 09/07/2019 CLINICAL DATA:  Fracture repair FLUOROSCOPY TIME:  1 minutes 23 seconds. Images: 5 EXAM: LEFT FEMUR 2 VIEWS; DG C-ARM 1-60 MIN COMPARISON:  None. FINDINGS: The left hip fracture is repair throughout the study with placement of 2 gamma nails and an intramedullary rod  affixed distally with an interlocking screw. IMPRESSION: Left hip fracture repair as above. Electronically Signed   By: Gerome Samavid  Williams III M.D   On: 09/07/2019 10:28    Microbiology: Recent Results (from the past 240 hour(s))  SARS CORONAVIRUS 2 (TAT 6-24 HRS) Nasopharyngeal Nasopharyngeal Swab     Status: None   Collection Time: 09/06/19 11:39 PM   Specimen: Nasopharyngeal Swab  Result Value Ref Range Status   SARS Coronavirus 2 NEGATIVE NEGATIVE Final    Comment: (NOTE) SARS-CoV-2 target nucleic acids are NOT DETECTED. The SARS-CoV-2 RNA is generally detectable in upper and lower respiratory specimens during the acute phase of infection.  Negative results do not preclude SARS-CoV-2 infection, do not rule out co-infections with other pathogens, and should not be used as the sole basis for treatment or other patient management decisions. Negative results must be combined with clinical observations, patient history, and epidemiological information. The expected result is Negative. Fact Sheet for Patients: HairSlick.no Fact Sheet for Healthcare Providers: quierodirigir.com This test is not yet approved or cleared by the [place] FDA and  has been authorized for detection and/or diagnosis of SARS-CoV-2 by FDA under an Emergency Use Authorization (EUA). This EUA will remain  in effect (meaning this test can be used) for the duration of the COVID-19 declaration under Section 56 4(b)(1) of the Act, 21 U.S.C. section 360bbb-3(b)(1), unless the authorization is terminated or revoked sooner. Performed at [facility], [address] [place], [place] [address]   Surgical PCR screen     Status: None   Collection Time: 09/07/19  3:39 AM   Specimen: Nasal Mucosa; Nasal Swab  Result Value Ref Range Status   MRSA, PCR NEGATIVE NEGATIVE Final   Staphylococcus aureus NEGATIVE NEGATIVE Final    Comment: (NOTE) The Xpert SA Assay  (FDA approved for NASAL specimens in patients 25 years of age and older), is one component of a comprehensive surveillance program. It is not intended to diagnose infection nor to guide or monitor treatment. Performed at [facility], [address], [place], [place] [address]      Labs: Basic Metabolic Panel: Recent Labs  Lab 09/06/19 2250 09/07/19 0514 09/08/19 0316 09/09/19 0322  NA 135 134* 133* 131*  K 4.3 4.1 4.6 4.1  CL 101 100 100 97*  CO2 22 24 24 24   GLUCOSE 125* 146* 118* 120*  BUN 16 14 12 10   CREATININE 0.73 0.69 0.54 0.43*  CALCIUM 9.4 8.9 9.0   9.0 8.8*  PHOS  --   --  3.5  --    Liver Function Tests: Recent Labs  Lab 09/06/19 2250 09/07/19 0514 09/08/19 0316  AST 19 18 18   ALT 13 14 12   ALKPHOS 63 59 52  BILITOT 0.2* 0.7 0.7  PROT 6.8 6.3* 5.9*  ALBUMIN 4.0 3.7 3.2*   No results for input(s): LIPASE, AMYLASE in the last 168 hours. No results for input(s): AMMONIA in the last 168 hours. CBC: Recent Labs  Lab 09/06/19 2250 09/07/19 0514 09/08/19 0316 09/09/19 0322  WBC 11.4* 10.8* 9.5 10.2  NEUTROABS 9.2* 9.1*  --   --   HGB 14.9 13.2 12.6 12.3  HCT 43.5 38.7 35.0* 34.5*  MCV 91.4 89.0 88.4 87.6  PLT 298 260 218 213   Cardiac Enzymes: No results for input(s): CKTOTAL, CKMB, CKMBINDEX, TROPONINI in the last 168 hours. BNP: BNP (last 3 results) No results for input(s): BNP in the last 8760 hours.  ProBNP (last 3 results) No results for input(s): PROBNP in the last 8760 hours.  CBG: No results for input(s): GLUCAP in the last 168 hours.  Principal Problem:   Closed left hip fracture [facility]) Active Problems:   Vitamin D deficiency   Fall   Time coordinating discharge: 38 minutes.  Signed:        Latrell Potempa, DO [facility]  09/09/2019, 4:12 PM

## 2019-09-09 NOTE — TOC Transition Note (Addendum)
Transition of Care [facility]) - CM/SW Discharge Note   Patient Details  Name: Annette Berry MRN: [id number] Date of Birth: 1941/04/17  Transition of Care [facility]) CM/SW Contact:  Midge Minium RN, BSN, NCM-BC, ACM-RN [phone] Phone Number: 09/09/2019, 2:04 PM   Clinical Narrative:    CM following for transitional needs. CM spoke to the patient to discuss the POC. Patient states she lived at home alone and was independent PTA and hasn't been hospitalized in 25 years. PCP/Demo verified. Patient is s/p fall with L IM nail placed 10/12. PT/OT eval completed with home vs SNF recommended with DME. CM reviewed DC options with patient electing to transition home with HH/DME; patient states a ramp is being installed today and her daughter-in-law/neighbors will assist during the day and her grandsons will stay with her at night. Patient is refusing SNF post-discharge. CMS HH Compare and DME list provided with [employer] selected for DME and [facility] for [facility] services. Patients DME will be delivered to her room prior to her discharging home. Patients family will provide transportation home. No further needs from CM.    Final next level of care: [place] Barriers to Discharge: No Barriers Identified   Patient Goals and CMS Choice Patient states their goals for this hospitalization and ongoing recovery are:: "to get back home" CMS Medicare.gov Compare Post Acute Care list provided to:: Patient Choice offered to / list presented to : Patient  Discharge Plan and Services                DME Arranged: Wheelchair manual, Bedside commode, Walker rolling DME Agency: [employer] Date DME Agency Contacted: 09/09/19 Time DME Agency Contacted: [id number] Representative spoke with at DME Agency: [place] (liaison) [place] Arranged: PT, OT, Nurse's Aide [place] Agency: [place] Date [place]: 09/09/19 Time [place]: [phone] Representative spoke with at [place]:  [place] (liaison)  Social Determinants of Health ([place]) Interventions     Readmission Risk Interventions No flowsheet data found.

## 2019-09-09 NOTE — Progress Notes (Signed)
Late Entry: Patient vomited twice this shift, called attending doctor and got an order for Zofran, med given, patient showing on symptoms, day shift nurse updated, call light within reach, will continue to monitor.

## 2019-09-09 NOTE — Progress Notes (Signed)
Physical Therapy Treatment Patient Details Name: Annette Berry MRN: [id number] DOB: [birth year] Today's Date: 09/09/2019    History of Present Illness 78yo female presenting after a fall at home, found to have L hip fracture on x-ray. Received L IM nail placed 10/12, now WBAT. No significant PMH    PT Comments    Pt demos significant improvements in capacity for transfers (min guard) and gait (15 ft) despite still requiring supervision and VCs for safety and sequencing. Discussed d/c plan with Pt (home vs. SNF) and Pt remains adamant that she prefers home with family/neighbor support. Recommend continuing skilled PT to address gait, endurance, and independence with functional tasks.   Patient suffers from LE pain, functional muscle weakness, impaired balance, impaired functional activity tolerance which impairs their ability to perform daily activities like extended gait in the home.  A walker alone will not resolve the issues with performing activities of daily living. A wheelchair will allow patient to safely perform daily activities.  The patient can self propel in the home or has a caregiver who can provide assistance.      Follow Up Recommendations  Home health PT;Supervision/Assistance - 24 hour     Equipment Recommendations  Rolling walker with 5" wheels;3in1 (PT);Wheelchair (measurements PT);Wheelchair cushion (measurements PT)    Recommendations for Other Services       Precautions / Restrictions Precautions Precautions: Fall Restrictions Weight Bearing Restrictions: Yes LLE Weight Bearing: Weight bearing as tolerated    Mobility  Bed Mobility Overal bed mobility: Needs Assistance Bed Mobility: Supine to Sit     Supine to sit: Supervision;HOB elevated     General bed mobility comments: pt in recliner at end of session  Transfers Overall transfer level: Needs assistance Equipment used: Rolling walker (2 wheeled) Transfers: Sit to/from Stand Sit to  Stand: Min guard         General transfer comment: min guard for sit/stand but use of VCs for sequencing and hand placement for both transfers  Ambulation/Gait Ambulation/Gait assistance: Min guard Gait Distance (Feet): 15 Feet Assistive device: Rolling walker (2 wheeled) Gait Pattern/deviations: Step-to pattern;Decreased step length - right;Decreased stance time - left;Decreased weight shift to left Gait velocity: decreased   General Gait Details: antalgic gait intially with step-to pattern, pt was able to improve to step-through pattern with VCs for RLE movement. Pt still needs extra time for ambulation and some supervision with RW, but demos improved endurance with ambulation today.   Stairs             Wheelchair Mobility    Modified Rankin (Stroke Patients Only)       Balance Overall balance assessment: Needs assistance Sitting-balance support: Single extremity supported;Feet supported Sitting balance-Leahy Scale: Good     Standing balance support: Bilateral upper extremity supported Standing balance-Leahy Scale: Fair                              Cognition Arousal/Alertness: Awake/alert Behavior During Therapy: WFL for tasks assessed/performed Overall Cognitive Status: Within Functional Limits for tasks assessed                                 General Comments: HOH      Exercises      General Comments        Pertinent Vitals/Pain Pain Assessment: No/denies pain Pain Score: 0-No pain Pain Intervention(s): Repositioned  Home Living                      Prior Function            PT Goals (current goals can now be found in the care plan section) Acute Rehab PT Goals Patient Stated Goal: to go home PT Goal Formulation: With patient Time For Goal Achievement: 09/22/19 Potential to Achieve Goals: Good Progress towards PT goals: Progressing toward goals    Frequency    Min 3X/week      PT Plan  Discharge plan needs to be updated;Equipment recommendations need to be updated    Co-evaluation              AM-PAC PT "6 Clicks" Mobility   Outcome Measure  Help needed turning from your back to your side while in a flat bed without using bedrails?: A Little Help needed moving from lying on your back to sitting on the side of a flat bed without using bedrails?: A Little Help needed moving to and from a bed to a chair (including a wheelchair)?: A Little Help needed standing up from a chair using your arms (e.g., wheelchair or bedside chair)?: A Little Help needed to walk in hospital room?: A Little Help needed climbing 3-5 steps with a railing? : A Lot 6 Click Score: 17    End of Session Equipment Utilized During Treatment: Gait belt Activity Tolerance: Patient tolerated treatment well Patient left: in chair;with call bell/phone within reach;with chair alarm set   PT Visit Diagnosis: Unsteadiness on feet (R26.81);Difficulty in walking, not elsewhere classified (R26.2);Pain;Muscle weakness (generalized) (M62.81);History of falling (Z91.81) Pain - Right/Left: Left Pain - part of body: Leg     Time: 9326-7124 PT Time Calculation (min) (ACUTE ONLY): 20 min  Charges:  $Gait Training: 8-22 mins                     Metro Kung, PT, DPT   Nedra Hai PT, DPT, CBIS  Supplemental Physical Therapist [facility]    Pager [phone] Acute Rehab Office [phone]

## 2019-09-09 NOTE — Progress Notes (Signed)
Orthopaedic Trauma Progress Note  S: Doing okay this morning, pain well controlled. Has been nauseous, vomited twice. Zofran helping. Has been able to ambulate in the room with therapy. No other issues  O:  Vitals:   09/09/19 0327 09/09/19 0819  BP: (!) 144/70 (!) 163/60  Pulse: 93 91  Resp: 15 16  Temp: 99.2 F (37.3 C) 98.9 F (37.2 C)  SpO2: 95% 96%    General - Sitting up in bedside chair, NAD  Left Lower Extremity - Dressings removed, incisions are clean, dry, intact. Tender with palpation of lateral thigh. Ankle dorsiflexion/plantarflexion intact. Full knee extension, flexion greater than 90 degrees. Sensation intact distally. Neurovascularly intact  Imaging: Stable post op imaging.   Labs:  Results for orders placed or performed during the hospital encounter of 09/06/19 (from the past 24 hour(s))  Basic metabolic panel     Status: Abnormal   Collection Time: 09/09/19  3:22 AM  Result Value Ref Range   Sodium 131 (L) 135 - 145 mmol/L   Potassium 4.1 3.5 - 5.1 mmol/L   Chloride 97 (L) 98 - 111 mmol/L   CO2 24 22 - 32 mmol/L   Glucose, Bld 120 (H) 70 - 99 mg/dL   BUN 10 8 - 23 mg/dL   Creatinine, Ser 0.43 (L) 0.44 - 1.00 mg/dL   Calcium 8.8 (L) 8.9 - 10.3 mg/dL   GFR calc non Af Amer >60 >60 mL/min   GFR calc Af Amer >60 >60 mL/min   Anion gap 10 5 - 15  CBC     Status: Abnormal   Collection Time: 09/09/19  3:22 AM  Result Value Ref Range   WBC 10.2 4.0 - 10.5 K/uL   RBC 3.94 3.87 - 5.11 MIL/uL   Hemoglobin 12.3 12.0 - 15.0 g/dL   HCT 34.5 (L) 36.0 - 46.0 %   MCV 87.6 80.0 - 100.0 fL   MCH 31.2 26.0 - 34.0 pg   MCHC 35.7 30.0 - 36.0 g/dL   RDW 12.6 11.5 - 15.5 %   Platelets 213 150 - 400 K/uL   nRBC 0.0 0.0 - 0.2 %    Assessment: 78 year old female s/p fall  Injuries: Left intertrochanteric femur fracture s/p cephalomedullary nailing  Weightbearing: WBAT LLE  Insicional and dressing care: Leave incisions open to air  Orthopedic device(s): none  CV/Blood  loss: Hgb stable. Hemodynamically stable  Pain management:  1. Tylenol 1000 mg q 6 hours scheduled 2.Tramadol 50 mg q 6 hours PRN 3. Morphine 0.5-1 mg q 2 hours PRN  VTE prophylaxis: Lovenox starting today  ID:  Ancef 2gm post op complete  Foley/Lines:  No foley, KVO IVFs  Impediments to Fracture Healing: Vitamin D deficiency, started on D3 supplement  Dispo: Up with therapy, PT recommending HH PT. Okay for discharge from ortho standpoint once cleared by medicine team and therapy.  Would recommend continuing 5,000 IU vitamin D3 supplement at discharge  Follow - up plan: 2 weeks    Mainor Hellmann A. [facility] ?[phone]? (phone)

## 2019-09-10 ENCOUNTER — Encounter: Payer: Self-pay | Admitting: Student

## 2019-09-10 DIAGNOSIS — Z720 Tobacco use: Secondary | ICD-10-CM | POA: Insufficient documentation

## 2020-06-07 ENCOUNTER — Encounter (HOSPITAL_COMMUNITY): Payer: Self-pay | Admitting: Emergency Medicine

## 2020-06-07 ENCOUNTER — Emergency Department (HOSPITAL_COMMUNITY)
Admission: EM | Admit: 2020-06-07 | Discharge: 2020-06-07 | Disposition: A | Discharge status: Home / Self Care | Payer: Medicare PPO | Attending: Emergency Medicine | Admitting: Emergency Medicine

## 2020-06-07 ENCOUNTER — Other Ambulatory Visit: Payer: Self-pay

## 2020-06-07 ENCOUNTER — Emergency Department (HOSPITAL_COMMUNITY): Payer: Medicare PPO

## 2020-06-07 DIAGNOSIS — S42271A Torus fracture of upper end of right humerus, initial encounter for closed fracture: Secondary | ICD-10-CM | POA: Diagnosis not present

## 2020-06-07 DIAGNOSIS — Y939 Activity, unspecified: Secondary | ICD-10-CM | POA: Diagnosis not present

## 2020-06-07 DIAGNOSIS — Z79899 Other long term (current) drug therapy: Secondary | ICD-10-CM | POA: Diagnosis not present

## 2020-06-07 DIAGNOSIS — Y929 Unspecified place or not applicable: Secondary | ICD-10-CM | POA: Insufficient documentation

## 2020-06-07 DIAGNOSIS — Y999 Unspecified external cause status: Secondary | ICD-10-CM | POA: Insufficient documentation

## 2020-06-07 DIAGNOSIS — R519 Headache, unspecified: Secondary | ICD-10-CM | POA: Diagnosis not present

## 2020-06-07 DIAGNOSIS — F17210 Nicotine dependence, cigarettes, uncomplicated: Secondary | ICD-10-CM | POA: Insufficient documentation

## 2020-06-07 DIAGNOSIS — W010XXA Fall on same level from slipping, tripping and stumbling without subsequent striking against object, initial encounter: Secondary | ICD-10-CM | POA: Diagnosis not present

## 2020-06-07 DIAGNOSIS — S4991XA Unspecified injury of right shoulder and upper arm, initial encounter: Secondary | ICD-10-CM | POA: Diagnosis present

## 2020-06-07 MED ORDER — DICLOFENAC SODIUM 1 % EX GEL: 4.0000 g | Freq: Four times a day (QID) | CUTANEOUS | 0 refills | Status: DC

## 2020-06-07 NOTE — ED Notes (Signed)
EDP notified on patient's hypertension .  

## 2020-06-07 NOTE — ED Provider Notes (Signed)
[facility] EMERGENCY DEPARTMENT Provider Note   CSN: [id number] Arrival date & time: 06/07/20  2001     History Chief Complaint  Patient presents with  . Fall  . Shoulder Pain    Annette Berry is a 79 y.o. female.  79 yo F with a chief complaints of right shoulder pain. Patient tripped over her feet and fell onto her right side. Struck her head on the ground. Denies loss of consciousness denies headache neck pain back pain chest pain abdominal pain. Pain mostly of pain to the right upper arm. Denies pain at the elbow or the wrist or the hand. Denies lower extremity pain.  The history is provided by the patient.  Fall This is a new problem. The current episode started 1 to 2 hours ago. The problem occurs rarely. The problem has not changed since onset.Pertinent negatives include no chest pain, no abdominal pain, no headaches and no shortness of breath. The symptoms are aggravated by bending and twisting. Nothing relieves the symptoms. She has tried nothing for the symptoms. The treatment provided no relief.  Shoulder Pain Associated symptoms: no fever        Past Medical History:  Diagnosis Date  . Arthritis   . HOH (hard of hearing)     Patient Active Problem List   Diagnosis Date Noted  . Tobacco abuse 09/10/2019  . Fall   . Closed comminuted intertrochanteric fracture of left femur (HCC) 09/07/2019  . Vitamin D deficiency 09/07/2019    Past Surgical History:  Procedure Laterality Date  . INTRAMEDULLARY (IM) NAIL INTERTROCHANTERIC Left 09/07/2019   INTRAMEDULLARY (IM) NAIL INTERTROCHANTRIC (Left Hip)  . INTRAMEDULLARY (IM) NAIL INTERTROCHANTERIC Left 09/07/2019   Procedure: INTRAMEDULLARY (IM) NAIL INTERTROCHANTRIC;  Surgeon: Roby Lofts, MD;  Location: [facility] OR;  Service: Orthopedics;  Laterality: Left;     OB History   No obstetric history on file.     Family History  Problem Relation Age of Onset  . Diabetes Mellitus I Neg Hx       Social History   Tobacco Use  . Smoking status: Current Every Day Smoker    Packs/day: 1.50    Years: 35.00    Pack years: 52.50    Types: Cigarettes  . Smokeless tobacco: Never Used  Vaping Use  . Vaping Use: Never used  Substance Use Topics  . Alcohol use: Not Currently  . Drug use: Never    Home Medications Prior to Admission medications   Medication Sig Start Date End Date Taking? Authorizing Provider  acetaminophen (TYLENOL) 650 MG CR tablet Take 650-1,300 mg by mouth See admin instructions. Take 1,300 mg by mouth two times a day and an additional 650 mg at bedtime as needed for arthritic pain   Yes [provider]  brimonidine (ALPHAGAN) 0.15 % ophthalmic solution Place 1 drop into both eyes 2 (two) times daily.  08/20/19  Yes [provider]  docusate sodium (COLACE) 100 MG capsule Take 100 mg by mouth See admin instructions. Take 100 mg by mouth two to three times a day   Yes [provider]  latanoprost (XALATAN) 0.005 % ophthalmic solution Place 1 drop into both eyes at bedtime.  08/20/19  Yes [provider]  cholecalciferol (VITAMIN D) 25 MCG tablet Take 2 tablets (2,000 Units total) by mouth 2 (two) times daily. Patient not taking: Reported on 06/07/2020 09/09/19   Swayze, Ava, DO  diclofenac Sodium (VOLTAREN) 1 % GEL Apply 4 g topically  4 (four) times daily. 06/07/20   Melene Plan, DO  feeding supplement, ENSURE ENLIVE, (ENSURE ENLIVE) LIQD Take 237 mLs by mouth 2 (two) times daily between meals. Patient not taking: Reported on 06/07/2020 09/10/19   Swayze, Ava, DO  nicotine (NICODERM CQ - DOSED IN MG/24 HOURS) 21 mg/24hr patch Place 1 patch (21 mg total) onto the skin daily. Patient not taking: Reported on 06/07/2020 09/10/19   Swayze, Ava, DO  ondansetron (ZOFRAN) 4 MG tablet Take 1 tablet (4 mg total) by mouth daily as needed for nausea or vomiting. Patient not taking: Reported on 06/07/2020 09/09/19 09/08/20  Swayze, Ava, DO   polyethylene glycol powder (MIRALAX) 17 GM/SCOOP powder Take 255 g by mouth daily. Patient not taking: Reported on 06/07/2020 09/09/19   Swayze, Ava, DO  traMADol (ULTRAM) 50 MG tablet Take 1 tablet (50 mg total) by mouth every 6 (six) hours as needed for moderate pain. Patient not taking: Reported on 06/07/2020 09/09/19   Swayze, Ava, DO    Allergies    Patient has no known allergies.  Review of Systems   Review of Systems  Constitutional: Negative for chills and fever.  HENT: Negative for congestion and rhinorrhea.   Eyes: Negative for redness and visual disturbance.  Respiratory: Negative for shortness of breath and wheezing.   Cardiovascular: Negative for chest pain and palpitations.  Gastrointestinal: Negative for abdominal pain, nausea and vomiting.  Genitourinary: Negative for dysuria and urgency.  Musculoskeletal: Negative for arthralgias and myalgias.  Skin: Negative for pallor and wound.  Neurological: Negative for dizziness and headaches.    Physical Exam Updated Vital Signs BP (!) 185/84 (BP Location: Left Arm)   Pulse (!) 103   Temp 98.2 F (36.8 C) (Oral)   Resp 16   Ht 5\' 2"  (1.575 m)   Wt 59 kg   SpO2 99%   BMI 23.78 kg/m   Physical Exam Vitals and nursing note reviewed.  Constitutional:      General: She is not in acute distress.    Appearance: She is well-developed. She is not diaphoretic.  HENT:     Head: Normocephalic.     Comments: Erythema to the right face along the jaw.  No pain to the jaw.   Eyes:     Pupils: Pupils are equal, round, and reactive to light.  Cardiovascular:     Rate and Rhythm: Normal rate and regular rhythm.     Heart sounds: No murmur heard.  No friction rub. No gallop.   Pulmonary:     Effort: Pulmonary effort is normal.     Breath sounds: No wheezing or rales.  Abdominal:     General: There is no distension.     Palpations: Abdomen is soft.     Tenderness: There is no abdominal tenderness.  Musculoskeletal:         General: Tenderness present.     Cervical back: Normal range of motion and neck supple.     Comments: Pain to the proximal humerus.  No pain at the clavicle elbow or forearm.  PMS intact distally.   Skin:    General: Skin is warm and dry.  Neurological:     Mental Status: She is alert and oriented to person, place, and time.  Psychiatric:        Behavior: Behavior normal.     ED Results / Procedures / Treatments   Labs (all labs ordered are listed, but only abnormal results are displayed) Labs Reviewed - No data to  display  EKG None  Radiology DG Shoulder Right  Result Date: 06/07/2020 CLINICAL DATA:  Fall EXAM: RIGHT SHOULDER - 2+ VIEW COMPARISON:  None. FINDINGS: There is a comminuted impacted proximal humerus fracture involving the greater tuberosity and surgical neck. There is slight superior displacement of the humeral shaft. The humeral head is slightly inferiorly subluxed on the glenoid surface. There is diffuse osteopenia. The [facility] joint appears to be intact. Overlying soft tissue swelling is seen. IMPRESSION: Comminuted impacted proximal right humerus fracture. Electronically Signed   By: Jonna Clark M.D.   On: 06/07/2020 21:06   CT Head Wo Contrast  Result Date: 06/07/2020 CLINICAL DATA:  79 year old female with head trauma. EXAM: CT HEAD WITHOUT CONTRAST TECHNIQUE: Contiguous axial images were obtained from the base of the skull through the vertex without intravenous contrast. COMPARISON:  None. FINDINGS: Brain: Mild age-related atrophy and moderate chronic microvascular ischemic changes. There is no acute intracranial hemorrhage. No mass effect or midline shift. No extra-axial fluid collection. Vascular: No hyperdense vessel or unexpected calcification. Skull: Normal. Negative for fracture or focal lesion. Sinuses/Orbits: No acute finding. Other: None IMPRESSION: 1. No acute intracranial hemorrhage. 2. Age-related atrophy and chronic microvascular ischemic changes.  Electronically Signed   By: Elgie Collard M.D.   On: 06/07/2020 22:53    Procedures Procedures (including critical care time)  Medications Ordered in ED Medications - No data to display  ED Course  I have reviewed the triage vital signs and the nursing notes.  Pertinent labs & imaging results that were available during my care of the patient were reviewed by me and considered in my medical decision making (see chart for details).    MDM Rules/Calculators/A&P                          79 yo F with a cc of a fall.  Non syncopal.  Complaining of pain to the right shoulder.  Did strike her head.  Plain film viewed by me with proximal humerus fx.  Place in sling.  Ortho follow up.   CT head negative.  D/c home.   11:17 PM:  I have discussed the diagnosis/risks/treatment options with the patient and family and believe the pt to be eligible for discharge home to follow-up with Ortho. We also discussed returning to the ED immediately if new or worsening sx occur. We discussed the sx which are most concerning (e.g., sudden worsening pain, fever, inability to tolerate by mouth) that necessitate immediate return. Medications administered to the patient during their visit and any new prescriptions provided to the patient are listed below.  Medications given during this visit Medications - No data to display   The patient appears reasonably screen and/or stabilized for discharge and I doubt any other medical condition or other [place] requiring further screening, evaluation, or treatment in the ED at this time prior to discharge.    Final Clinical Impression(s) / ED Diagnoses Final diagnoses:  Closed torus fracture of proximal end of right humerus, initial encounter    Rx / DC Orders ED Discharge Orders         Ordered    diclofenac Sodium (VOLTAREN) 1 % GEL  4 times daily     Discontinue  Reprint     06/07/20 2258           Melene Plan, DO 06/07/20 2317

## 2020-06-07 NOTE — ED Triage Notes (Signed)
Per EMS, pt had a mechanical fall today, it took 2 hours to get help, denies LOC.  Pt did hit head on bench, lac under chin, skin tear to R forearm, swelling and pain to right shoulder, (EMS believes it is dislocated).  No pain to neck or back.  Given 55ml fentanyl by EMS, was placed on 2L due to O2 stats in high 80s after meds.    20G L AC 174/98 120HR [id number]

## 2020-06-07 NOTE — Discharge Instructions (Signed)
Take tylenol 1000mg (2 extra strength) four times a day.   Apply the gel to the areas of pain.  Follow up with ortho.

## 2021-01-11 ENCOUNTER — Other Ambulatory Visit: Payer: Self-pay

## 2021-01-11 ENCOUNTER — Inpatient Hospital Stay (HOSPITAL_COMMUNITY)
Admission: EM | Admit: 2021-01-11 | Discharge: 2021-01-14 | DRG: 177 | Disposition: A | Discharge status: Skilled Nursing Facility | Payer: Medicare PPO | Attending: Internal Medicine | Admitting: Internal Medicine

## 2021-01-11 ENCOUNTER — Emergency Department (HOSPITAL_COMMUNITY): Payer: Medicare PPO

## 2021-01-11 DIAGNOSIS — I10 Essential (primary) hypertension: Secondary | ICD-10-CM | POA: Diagnosis present

## 2021-01-11 DIAGNOSIS — H409 Unspecified glaucoma: Secondary | ICD-10-CM | POA: Diagnosis present

## 2021-01-11 DIAGNOSIS — Z9114 Patient's other noncompliance with medication regimen: Secondary | ICD-10-CM | POA: Diagnosis not present

## 2021-01-11 DIAGNOSIS — G9341 Metabolic encephalopathy: Secondary | ICD-10-CM | POA: Diagnosis present

## 2021-01-11 DIAGNOSIS — R410 Disorientation, unspecified: Secondary | ICD-10-CM

## 2021-01-11 DIAGNOSIS — Z87891 Personal history of nicotine dependence: Secondary | ICD-10-CM | POA: Diagnosis not present

## 2021-01-11 DIAGNOSIS — R531 Weakness: Secondary | ICD-10-CM

## 2021-01-11 DIAGNOSIS — Z79891 Long term (current) use of opiate analgesic: Secondary | ICD-10-CM | POA: Diagnosis not present

## 2021-01-11 DIAGNOSIS — E876 Hypokalemia: Secondary | ICD-10-CM | POA: Diagnosis present

## 2021-01-11 DIAGNOSIS — E860 Dehydration: Secondary | ICD-10-CM | POA: Diagnosis present

## 2021-01-11 DIAGNOSIS — Z79899 Other long term (current) drug therapy: Secondary | ICD-10-CM

## 2021-01-11 DIAGNOSIS — H9190 Unspecified hearing loss, unspecified ear: Secondary | ICD-10-CM | POA: Diagnosis present

## 2021-01-11 DIAGNOSIS — I959 Hypotension, unspecified: Secondary | ICD-10-CM | POA: Diagnosis present

## 2021-01-11 DIAGNOSIS — U071 COVID-19: Principal | ICD-10-CM | POA: Diagnosis present

## 2021-01-11 DIAGNOSIS — F05 Delirium due to known physiological condition: Secondary | ICD-10-CM | POA: Diagnosis not present

## 2021-01-11 DIAGNOSIS — E559 Vitamin D deficiency, unspecified: Secondary | ICD-10-CM | POA: Diagnosis present

## 2021-01-11 DIAGNOSIS — M1990 Unspecified osteoarthritis, unspecified site: Secondary | ICD-10-CM | POA: Diagnosis present

## 2021-01-11 LAB — URINALYSIS, ROUTINE W REFLEX MICROSCOPIC
Bilirubin Urine: NEGATIVE
Glucose, UA: NEGATIVE mg/dL
Hgb urine dipstick: NEGATIVE
Ketones, ur: 5 mg/dL — AB
Leukocytes,Ua: NEGATIVE
Nitrite: NEGATIVE
Protein, ur: NEGATIVE mg/dL
Specific Gravity, Urine: 1.02 (ref 1.005–1.030)
pH: 5 (ref 5.0–8.0)

## 2021-01-11 LAB — CBG MONITORING, ED: Glucose-Capillary: 140 mg/dL — ABNORMAL HIGH (ref 70–99)

## 2021-01-11 LAB — PROTIME-INR
INR: 1 (ref 0.8–1.2)
Prothrombin Time: 12.8 seconds (ref 11.4–15.2)

## 2021-01-11 LAB — COMPREHENSIVE METABOLIC PANEL WITH GFR
ALT: 20 U/L (ref 0–44)
AST: 31 U/L (ref 15–41)
Albumin: 3.7 g/dL (ref 3.5–5.0)
Alkaline Phosphatase: 59 U/L (ref 38–126)
Anion gap: 14 (ref 5–15)
BUN: 12 mg/dL (ref 8–23)
CO2: 20 mmol/L — ABNORMAL LOW (ref 22–32)
Calcium: 9.1 mg/dL (ref 8.9–10.3)
Chloride: 102 mmol/L (ref 98–111)
Creatinine, Ser: 0.95 mg/dL (ref 0.44–1.00)
GFR, Estimated: >60 mL/min
Glucose, Bld: 144 mg/dL — ABNORMAL HIGH (ref 70–99)
Potassium: 3.8 mmol/L (ref 3.5–5.1)
Sodium: 136 mmol/L (ref 135–145)
Total Bilirubin: 0.6 mg/dL (ref 0.3–1.2)
Total Protein: 6.5 g/dL (ref 6.5–8.1)

## 2021-01-11 LAB — CBC WITH DIFFERENTIAL/PLATELET
Abs Immature Granulocytes: 0.01 K/uL (ref 0.00–0.07)
Basophils Absolute: 0 K/uL (ref 0.0–0.1)
Basophils Relative: 0 %
Eosinophils Absolute: 0 K/uL (ref 0.0–0.5)
Eosinophils Relative: 0 %
HCT: 42 % (ref 36.0–46.0)
Hemoglobin: 14.5 g/dL (ref 12.0–15.0)
Immature Granulocytes: 0 %
Lymphocytes Relative: 22 %
Lymphs Abs: 1.1 K/uL (ref 0.7–4.0)
MCH: 31.2 pg (ref 26.0–34.0)
MCHC: 34.5 g/dL (ref 30.0–36.0)
MCV: 90.3 fL (ref 80.0–100.0)
Monocytes Absolute: 0.7 K/uL (ref 0.1–1.0)
Monocytes Relative: 14 %
Neutro Abs: 3.1 K/uL (ref 1.7–7.7)
Neutrophils Relative %: 64 %
Platelets: 205 K/uL (ref 150–400)
RBC: 4.65 MIL/uL (ref 3.87–5.11)
RDW: 13.4 % (ref 11.5–15.5)
WBC: 4.8 K/uL (ref 4.0–10.5)
nRBC: 0 % (ref 0.0–0.2)

## 2021-01-11 LAB — TROPONIN I (HIGH SENSITIVITY)
Troponin I (High Sensitivity): 11 ng/L (ref <18)
Troponin I (High Sensitivity): 9 ng/L (ref <18)

## 2021-01-11 LAB — APTT: aPTT: 27 s (ref 24–36)

## 2021-01-11 LAB — RESP PANEL BY RT-PCR (FLU A&B, COVID) ARPGX2
Influenza A by PCR: NEGATIVE
Influenza B by PCR: NEGATIVE
SARS Coronavirus 2 by RT PCR: POSITIVE — AB

## 2021-01-11 LAB — LACTIC ACID, PLASMA
Lactic Acid, Venous: 2.4 mmol/L (ref 0.5–1.9)
Lactic Acid, Venous: 1.8 mmol/L (ref 0.5–1.9)

## 2021-01-11 MED ORDER — INSULIN ASPART 100 UNIT/ML ~~LOC~~ SOLN: 0.0000 [IU] | Freq: Three times a day (TID) | SUBCUTANEOUS | Status: DC

## 2021-01-11 MED ORDER — ENOXAPARIN SODIUM 40 MG/0.4ML ~~LOC~~ SOLN
40.0000 mg | SUBCUTANEOUS | Status: DC
  Administered 2021-01-11 – 2021-01-13 (×3): 40 mg via SUBCUTANEOUS
  Filled 2021-01-11 (×3): qty 0.4

## 2021-01-11 MED ORDER — ALBUTEROL SULFATE HFA 108 (90 BASE) MCG/ACT IN AERS
2.0000 | INHALATION_SPRAY | Freq: Four times a day (QID) | RESPIRATORY_TRACT | Status: DC
  Administered 2021-01-12 – 2021-01-14 (×11): 2 via RESPIRATORY_TRACT
  Filled 2021-01-11 (×2): qty 6.7

## 2021-01-11 MED ORDER — POLYETHYLENE GLYCOL 3350 17 G PO PACK: 17.0000 g | PACK | Freq: Every day | ORAL | Status: DC | PRN

## 2021-01-11 MED ORDER — SODIUM CHLORIDE 0.9 % IV SOLN
200.0000 mg | Freq: Once | INTRAVENOUS | Status: AC
  Administered 2021-01-11: 200 mg via INTRAVENOUS
  Filled 2021-01-11: qty 40

## 2021-01-11 MED ORDER — GUAIFENESIN-DM 100-10 MG/5ML PO SYRP: 10.0000 mL | ORAL_SOLUTION | ORAL | Status: DC | PRN

## 2021-01-11 MED ORDER — LACTATED RINGERS IV SOLN: INTRAVENOUS | Status: AC

## 2021-01-11 MED ORDER — ACETAMINOPHEN 325 MG PO TABS
650.0000 mg | ORAL_TABLET | Freq: Four times a day (QID) | ORAL | Status: DC | PRN
  Administered 2021-01-14: 650 mg via ORAL
  Filled 2021-01-11 (×2): qty 2

## 2021-01-11 MED ORDER — SODIUM CHLORIDE 0.9 % IV SOLN
100.0000 mg | Freq: Every day | INTRAVENOUS | Status: DC
  Administered 2021-01-12 – 2021-01-14 (×3): 100 mg via INTRAVENOUS
  Filled 2021-01-11 (×4): qty 20

## 2021-01-11 MED ORDER — SODIUM CHLORIDE 0.9 % IV SOLN
1.0000 g | Freq: Once | INTRAVENOUS | Status: AC
  Administered 2021-01-11: 1 g via INTRAVENOUS
  Filled 2021-01-11: qty 10

## 2021-01-11 MED ORDER — DEXAMETHASONE SODIUM PHOSPHATE 10 MG/ML IJ SOLN
10.0000 mg | Freq: Once | INTRAMUSCULAR | Status: AC
  Administered 2021-01-11: 10 mg via INTRAVENOUS
  Filled 2021-01-11: qty 1

## 2021-01-11 MED ORDER — LACTATED RINGERS IV BOLUS (SEPSIS)
1000.0000 mL | Freq: Once | INTRAVENOUS | Status: AC
  Administered 2021-01-11: 1000 mL via INTRAVENOUS

## 2021-01-11 MED ORDER — ONDANSETRON HCL 4 MG/2ML IJ SOLN: 4.0000 mg | Freq: Four times a day (QID) | INTRAMUSCULAR | Status: DC | PRN

## 2021-01-11 MED ORDER — ONDANSETRON HCL 4 MG PO TABS: 4.0000 mg | ORAL_TABLET | Freq: Four times a day (QID) | ORAL | Status: DC | PRN

## 2021-01-11 NOTE — ED Triage Notes (Signed)
Pt sick with n/v and fever since Sunday. Pt's home aid came this morning and noted her to be more lethargic than usual and having difficulty walking. Pt then had syncopal episode. Hypotensive on EMS arrival so they started an epi drip. Pt doesn't remember much of what happened this morning. Arrives A/O, sitting up in stretcher.

## 2021-01-11 NOTE — ED Provider Notes (Signed)
[facility] EMERGENCY DEPARTMENT Provider Note   CSN: [id number] Arrival date & time: 01/11/21  1507     History No chief complaint on file.   Annette Berry is a 80 y.o. female.  Patient is a 80 year old female with a history of hypertension that is untreated and prior tobacco abuse who is presenting today with paramedics for concern for code sepsis.  Patient family reports that since Sunday 4 days prior to arrival she has had fever and vomiting.  Patient reports that she has been eating very little but today family noted that she was very lethargic and minimally responsive.  Normally she can do her ADLs independently but today she could not even get into the shower on her own.  Her nurse helped her into the shower but reported she was like dead weight.  Because of her decreased responsiveness they called paramedics.  They reported when they arrived she was very lethargic and minimally responsive.  Initial blood pressure was 80/40.  She was given an epi push and then started on an epi drip and they reported she woke up in route to the hospital.  Patient denies any chest pain, shortness of breath, abdominal pain.  She denies any vomiting today and has not had any diarrhea.  She has had a minimal cough but denies any sputum production.  She denies any dysuria, frequency or urgency.  She has had her first round of Covid vaccines but did not receive a booster.  She no longer uses tobacco and denies use of inhalers or oxygen at home.  Paramedics did report she initially was in the 80s on room air and that improved with nasal cannula oxygen.  The history is provided by the patient.       Past Medical History:  Diagnosis Date  . Arthritis   . HOH (hard of hearing)     Patient Active Problem List   Diagnosis Date Noted  . Tobacco abuse 09/10/2019  . Fall   . Closed comminuted intertrochanteric fracture of left femur (HCC) 09/07/2019  . Vitamin D deficiency  09/07/2019    Past Surgical History:  Procedure Laterality Date  . INTRAMEDULLARY (IM) NAIL INTERTROCHANTERIC Left 09/07/2019   INTRAMEDULLARY (IM) NAIL INTERTROCHANTRIC (Left Hip)  . INTRAMEDULLARY (IM) NAIL INTERTROCHANTERIC Left 09/07/2019   Procedure: INTRAMEDULLARY (IM) NAIL INTERTROCHANTRIC;  Surgeon: Roby Lofts, MD;  Location: [facility] OR;  Service: Orthopedics;  Laterality: Left;     OB History   No obstetric history on file.     Family History  Problem Relation Age of Onset  . Diabetes Mellitus I Neg Hx     Social History   Tobacco Use  . Smoking status: Current Every Day Smoker    Packs/day: 1.50    Years: 35.00    Pack years: 52.50    Types: Cigarettes  . Smokeless tobacco: Never Used  Vaping Use  . Vaping Use: Never used  Substance Use Topics  . Alcohol use: Not Currently  . Drug use: Never    Home Medications Prior to Admission medications   Medication Sig Start Date End Date Taking? Authorizing Provider  acetaminophen (TYLENOL) 650 MG CR tablet Take 650-1,300 mg by mouth See admin instructions. Take 1,300 mg by mouth two times a day and an additional 650 mg at bedtime as needed for arthritic pain    [provider]  brimonidine (ALPHAGAN) 0.15 % ophthalmic solution Place 1 drop into both eyes 2 (two) times daily.  08/20/19  [provider]  cholecalciferol (VITAMIN D) 25 MCG tablet Take 2 tablets (2,000 Units total) by mouth 2 (two) times daily. Patient not taking: Reported on 06/07/2020 09/09/19   Swayze, Ava, DO  diclofenac Sodium (VOLTAREN) 1 % GEL Apply 4 g topically 4 (four) times daily. 06/07/20   Melene Plan, DO  docusate sodium (COLACE) 100 MG capsule Take 100 mg by mouth See admin instructions. Take 100 mg by mouth two to three times a day    [provider]  feeding supplement, ENSURE ENLIVE, (ENSURE ENLIVE) LIQD Take 237 mLs by mouth 2 (two) times daily between meals. Patient not taking: Reported on 06/07/2020 09/10/19    Swayze, Ava, DO  latanoprost (XALATAN) 0.005 % ophthalmic solution Place 1 drop into both eyes at bedtime.  08/20/19   [provider]  nicotine (NICODERM CQ - DOSED IN MG/24 HOURS) 21 mg/24hr patch Place 1 patch (21 mg total) onto the skin daily. Patient not taking: Reported on 06/07/2020 09/10/19   Swayze, Ava, DO  polyethylene glycol powder (MIRALAX) 17 GM/SCOOP powder Take 255 g by mouth daily. Patient not taking: Reported on 06/07/2020 09/09/19   Swayze, Ava, DO  traMADol (ULTRAM) 50 MG tablet Take 1 tablet (50 mg total) by mouth every 6 (six) hours as needed for moderate pain. Patient not taking: Reported on 06/07/2020 09/09/19   Swayze, Ava, DO    Allergies    Patient has no known allergies.  Review of Systems   Review of Systems  All other systems reviewed and are negative.   Physical Exam Updated Vital Signs BP (!) 123/56 (BP Location: Right Arm)   Pulse 85   Temp 97.8 F (36.6 C) (Oral)   Resp 16   Ht 5\' 4"  (1.626 m)   Wt 63.5 kg   SpO2 94%   BMI 24.03 kg/m   Physical Exam Vitals and nursing note reviewed.  Constitutional:      General: She is not in acute distress.    Appearance: She is well-developed and well-nourished.  HENT:     Head: Normocephalic and atraumatic.     Mouth/Throat:     Mouth: Mucous membranes are dry.  Eyes:     Extraocular Movements: EOM normal.     Pupils: Pupils are equal, round, and reactive to light.  Cardiovascular:     Rate and Rhythm: Normal rate and regular rhythm.     Pulses: Intact distal pulses.     Heart sounds: Normal heart sounds. No murmur heard. No friction rub.  Pulmonary:     Effort: Pulmonary effort is normal.     Breath sounds: Decreased air movement present. Examination of the right-lower field reveals decreased breath sounds. Examination of the left-lower field reveals decreased breath sounds. Decreased breath sounds present. No wheezing or rales.  Abdominal:     General: Bowel sounds are normal. There is  no distension.     Palpations: Abdomen is soft.     Tenderness: There is no abdominal tenderness. There is no guarding or rebound.  Musculoskeletal:        General: No tenderness. Normal range of motion.     Right lower leg: Edema present.     Left lower leg: Edema present.     Comments: Trace pitting edema bilateral ankles  Skin:    General: Skin is warm and dry.     Findings: No rash.  Neurological:     Mental Status: She is alert and oriented to person, place, and time. Mental  status is at baseline.     Cranial Nerves: No cranial nerve deficit.     Sensory: No sensory deficit.     Motor: No weakness.  Psychiatric:        Mood and Affect: Mood and affect and mood normal.        Behavior: Behavior normal.        Thought Content: Thought content normal.     ED Results / Procedures / Treatments   Labs (all labs ordered are listed, but only abnormal results are displayed) Labs Reviewed  RESP PANEL BY RT-PCR (FLU A&B, COVID) ARPGX2 - Abnormal; Notable for the following components:      Result Value   SARS Coronavirus 2 by RT PCR POSITIVE (*)    All other components within normal limits  LACTIC ACID, PLASMA - Abnormal; Notable for the following components:   Lactic Acid, Venous 2.4 (*)    All other components within normal limits  COMPREHENSIVE METABOLIC PANEL - Abnormal; Notable for the following components:   CO2 20 (*)    Glucose, Bld 144 (*)    All other components within normal limits  CULTURE, BLOOD (ROUTINE X 2)  CULTURE, BLOOD (ROUTINE X 2)  URINE CULTURE  CBC WITH DIFFERENTIAL/PLATELET  PROTIME-INR  APTT  LACTIC ACID, PLASMA  URINALYSIS, ROUTINE W REFLEX MICROSCOPIC  TROPONIN I (HIGH SENSITIVITY)  TROPONIN I (HIGH SENSITIVITY)    EKG EKG Interpretation  Date/Time:  Wednesday January 11 2021 15:15:13 EST Ventricular Rate:  87 PR Interval:    QRS Duration: 99 QT Interval:  386 QTC Calculation: 465 R Axis:   58 Text Interpretation: Sinus rhythm Anterior  infarct, old Artifact No significant change since last tracing Confirmed by Gwyneth Sprout ([id number]) on 01/11/2021 3:36:59 PM   Radiology No results found.  Procedures Procedures   Medications Ordered in ED Medications  lactated ringers infusion (has no administration in time range)  lactated ringers bolus 1,000 mL (has no administration in time range)  cefTRIAXone (ROCEPHIN) 1 g in sodium chloride 0.9 % 100 mL IVPB (has no administration in time range)    ED Course  I have reviewed the triage vital signs and the nursing notes.  Pertinent labs & imaging results that were available during my care of the patient were reviewed by me and considered in my medical decision making (see chart for details).    MDM Rules/Calculators/A&P                          Patient presenting today with concerns for sepsis.  Reported fever for the last 4 days and vomiting.  Initial lethargy at home but currently is awake alert and answering all questions.  She denies any pain or discomfort at this time.  Poor oral intake and anorexia over the last 4 days.  She has no abdominal pain on exam and does have decreased breath sounds but no localized crackles.  She is satting 94% on room air without tachypnea and a normal heart rate.  It was reported that her initial blood pressure with EMS was 80/40 and she was given epi and placed on an epi drip prior to arrival.  That has been turned off and initial pressure was 123/56.  Will give patient a liter bolus, labs are pending.  Chest x-ray is pending.  EKG with artifact but no other acute findings.  She was covered with a dose of Rocephin until further results have returned.  5:34 PM  CMP without acute changes, lactic acid elevated at 2.4, Covid positive and is most likely the explanation for her symptoms today.  Oxygen saturation between 90 and 93% on room air.  Patient lives alone and is very weak and unable to stand or walk without becoming winded.  Does have home  health that comes in a few hours a day but otherwise has to take care of herself.  CBC within normal limits, INR within normal limits, chest x-ray without acute findings.  Patient is receiving IV fluids but did not get 30/kg because there is no evidence of septic shock and this is symptoms related to Covid.  We will plan on admitting for observation and IV remdesivir as she is on day 4 of illness.  She was also given Decadron.  MDM Number of Diagnoses or Management Options   Amount and/or Complexity of Data Reviewed Clinical lab tests: ordered and reviewed Tests in the radiology section of CPT: ordered and reviewed Tests in the medicine section of CPT: ordered and reviewed Decide to obtain previous medical records or to obtain history from someone other than the patient: yes Obtain history from someone other than the patient: yes Review and summarize past medical records: yes Discuss the patient with other providers: yes Independent visualization of images, tracings, or specimens: yes  Risk of Complications, Morbidity, and/or Mortality Presenting problems: high Diagnostic procedures: low Management options: moderate  Patient Progress Patient progress: improved    Final Clinical Impression(s) / ED Diagnoses Final diagnoses:  COVID  Weakness  Dehydration    Rx / DC Orders ED Discharge Orders    None       Gwyneth SproutPlunkett, Lashawna Poche, MD 01/11/21 1736

## 2021-01-11 NOTE — H&P (Signed)
Date: 01/11/2021               Patient Name:  Annette Berry MRN: [id number]  DOB: 04/27/1941 Age / Sex: 80 y.o., female   PCP: Annette Mask, MD         Medical Service: Internal Medicine Teaching Service         Attending Physician: Dr. Reymundo Poll, MD    First Contact: Dr. Marijo Berry Pager: [phone]  Second Contact: Dr. Ephriam Berry Pager: [phone]       After Hours (After 5p/  First Contact Pager: [phone]  weekends / holidays): Second Contact Pager: [phone]   Chief Complaint: fever, n/v  History of Present Illness:  Ms. Annette Berry is 79yo person with hypertension who presented to [facility] ED with four days of fever, vomitting and general fatigue. Per EDP, earlier today, family noted that she became increasing somnolent, prompting them to call EMS. Patient is poor historian and does not remember many of the events of the last few days. She does recall watching the Super Bowl on Sunday, but does not remember having any symptoms. She mentions a family member told her she was breathing fast, but she doesn't recall. Reports decreased appetite, vomiting, and general weakness that has worsened over the last few days. She says she arrived today to the ED via ambulance, but does not remember who called 911. Denies chest pains, palpitations, dyspnea, cough, abdominal pain, dysuria, leg swelling. No prior history of clots. Denies recent sick contacts. Of note, patient was vaccinated x2 against COVID-19.   Meds: no home medications  Allergies: Allergies as of 01/11/2021  . (No Known Allergies)   Past Medical History:  Diagnosis Date  . Arthritis   . HOH (hard of hearing)    Family History: No pertinent family history   Social History: Patient lives at home by herself, but says "a woman" lives with her sometimes. She reports previous smoking "for a long time," but quit after her last hospitalization. Denies alcohol or recreational drug use.   Review of  Systems: HPI negative unless stated in HPI  Physical Exam: Blood pressure (!) 161/68, pulse 94, temperature 97.8 F (36.6 C), temperature source Oral, resp. rate 16, height 5\' 4"  (1.626 m), weight 63.5 kg, SpO2 91 %. General: Elderly woman,  no acute distress HEENT: No scleral icterus. No rhinorhea. Diminished hearing bilaterally Cardiac: RRR. Extremities warm. +1 LE edema bilaterally. Pulm: Normal work of breathing. No accessory muscle use. Mild expiratory wheezes throughout. GI: Soft, non-tender, non-distended. Normoactive bowel sounds Neuro: Alert and oriented x3. No focal deficits. Strength 4/5 throughout. Sensation in tact. Skin: Warm, dry. No rashes appreciated. MSK: Normal bulk, tone throughout. Psych: Normal mood, speech, affect  EKG: Normal sinus rhythm  CXR: No acute cardiopulmonary process.  Assessment & Plan by Problem: Ms. Annette Berry is 79yo person with hypertension admitted 2/16 with COVID-19 infection.  Active Problems:   COVID-19  #Symptomatic COVID-19 infection #Generalized weakness  Patient presenting with non-respiratory symptoms of COVID-19, including mild encephalopathy, vomiting, and generalized weakness. Patient does not have previous diagnosis of lung or cardiac disease, but has extensive history of smoking with expiratory wheezing on exam, no focal neurological deficits. Likely patient has component of COPD, putting her at increase risk for decompensation. Patient did receive two COVID-19 vaccines. Currently she is mildly hypotensive, sating well on room air. Plan to continue anti-viral and decadron. - C/w remdesivir, decadron - Daily inflammatory markers - Albuterol PRN - Flutter valve,  incentive spirometry - PT/OT  #Hx of hypertension  Not on antihypertensives at home antihypertensives. Will continue to monitor.   Dispo: Admit patient to Inpatient with expected length of stay greater than 2 midnights.  Signed: Evlyn Kanner,  MD Internal Medicine Resident PGY-1 [facility] Pager: [phone] 01/11/2021 8:23 PM

## 2021-01-11 NOTE — Sepsis Progress Note (Signed)
elink monitoring code sepsis.  

## 2021-01-11 NOTE — ED Notes (Signed)
Pt denies any complaints at this time

## 2021-01-12 ENCOUNTER — Encounter (HOSPITAL_COMMUNITY): Payer: Self-pay | Admitting: Student

## 2021-01-12 LAB — COMPREHENSIVE METABOLIC PANEL
ALT: 18 U/L (ref 0–44)
AST: 26 U/L (ref 15–41)
Albumin: 3.5 g/dL (ref 3.5–5.0)
Alkaline Phosphatase: 58 U/L (ref 38–126)
Anion gap: 11 (ref 5–15)
BUN: 7 mg/dL — ABNORMAL LOW (ref 8–23)
CO2: 24 mmol/L (ref 22–32)
Calcium: 8.9 mg/dL (ref 8.9–10.3)
Chloride: 99 mmol/L (ref 98–111)
Creatinine, Ser: 0.61 mg/dL (ref 0.44–1.00)
GFR, Estimated: >60 mL/min (ref >60)
Glucose, Bld: 141 mg/dL — ABNORMAL HIGH (ref 70–99)
Potassium: 3.6 mmol/L (ref 3.5–5.1)
Sodium: 134 mmol/L — ABNORMAL LOW (ref 135–145)
Total Bilirubin: 0.5 mg/dL (ref 0.3–1.2)
Total Protein: 6.6 g/dL (ref 6.5–8.1)

## 2021-01-12 LAB — CBC WITH DIFFERENTIAL/PLATELET
Abs Immature Granulocytes: 0.01 10*3/uL (ref 0.00–0.07)
Basophils Absolute: 0 10*3/uL (ref 0.0–0.1)
Basophils Relative: 0 %
Eosinophils Absolute: 0 10*3/uL (ref 0.0–0.5)
Eosinophils Relative: 0 %
HCT: 41.2 % (ref 36.0–46.0)
Hemoglobin: 14 g/dL (ref 12.0–15.0)
Immature Granulocytes: 0 %
Lymphocytes Relative: 20 %
Lymphs Abs: 0.7 10*3/uL (ref 0.7–4.0)
MCH: 30.4 pg (ref 26.0–34.0)
MCHC: 34 g/dL (ref 30.0–36.0)
MCV: 89.4 fL (ref 80.0–100.0)
Monocytes Absolute: 0.2 10*3/uL (ref 0.1–1.0)
Monocytes Relative: 6 %
Neutro Abs: 2.5 10*3/uL (ref 1.7–7.7)
Neutrophils Relative %: 74 %
Platelets: 202 10*3/uL (ref 150–400)
RBC: 4.61 MIL/uL (ref 3.87–5.11)
RDW: 13 % (ref 11.5–15.5)
WBC: 3.4 10*3/uL — ABNORMAL LOW (ref 4.0–10.5)
nRBC: 0 % (ref 0.0–0.2)

## 2021-01-12 LAB — GLUCOSE, CAPILLARY
Glucose-Capillary: 109 mg/dL — ABNORMAL HIGH (ref 70–99)
Glucose-Capillary: 102 mg/dL — ABNORMAL HIGH (ref 70–99)
Glucose-Capillary: 98 mg/dL (ref 70–99)
Glucose-Capillary: 114 mg/dL — ABNORMAL HIGH (ref 70–99)

## 2021-01-12 LAB — MAGNESIUM: Magnesium: 1.5 mg/dL — ABNORMAL LOW (ref 1.7–2.4)

## 2021-01-12 LAB — FERRITIN: Ferritin: 97 ng/mL (ref 11–307)

## 2021-01-12 LAB — PHOSPHORUS: Phosphorus: 3.5 mg/dL (ref 2.5–4.6)

## 2021-01-12 LAB — D-DIMER, QUANTITATIVE: D-Dimer, Quant: 0.68 ug/mL-FEU — ABNORMAL HIGH (ref 0.00–0.50)

## 2021-01-12 LAB — C-REACTIVE PROTEIN: CRP: 0.8 mg/dL (ref <1.0)

## 2021-01-12 MED ORDER — LATANOPROST 0.005 % OP SOLN
1.0000 [drp] | Freq: Every day | OPHTHALMIC | Status: DC
  Administered 2021-01-12 – 2021-01-13 (×2): 1 [drp] via OPHTHALMIC
  Filled 2021-01-12: qty 2.5

## 2021-01-12 MED ORDER — LOSARTAN POTASSIUM 50 MG PO TABS
25.0000 mg | ORAL_TABLET | Freq: Every day | ORAL | Status: DC
  Administered 2021-01-12 – 2021-01-14 (×3): 25 mg via ORAL
  Filled 2021-01-12 (×3): qty 1

## 2021-01-12 MED ORDER — MAGNESIUM SULFATE 2 GM/50ML IV SOLN
2.0000 g | Freq: Once | INTRAVENOUS | Status: AC
  Administered 2021-01-12: 2 g via INTRAVENOUS
  Filled 2021-01-12: qty 50

## 2021-01-12 MED ORDER — BRIMONIDINE TARTRATE 0.15 % OP SOLN
1.0000 [drp] | Freq: Two times a day (BID) | OPHTHALMIC | Status: DC
  Administered 2021-01-12 – 2021-01-14 (×4): 1 [drp] via OPHTHALMIC
  Filled 2021-01-12: qty 5

## 2021-01-12 MED ORDER — POLYETHYLENE GLYCOL 3350 17 G PO PACK
17.0000 g | PACK | Freq: Every day | ORAL | Status: DC
  Administered 2021-01-12: 17 g via ORAL
  Filled 2021-01-12: qty 1

## 2021-01-12 NOTE — ED Notes (Signed)
Pt cleaned of urinary incontinence. New brief applied. Purewick placed back in correct position.

## 2021-01-12 NOTE — Progress Notes (Signed)
PT Cancellation Note  Patient Details Name: Annette Berry MRN: [id number] DOB: 10/23/1941   Cancelled Treatment:    Reason Eval/Treat Not Completed: Patient at procedure or test/unavailable  Patient getting nursing care. Will return shortly to complete evaluation   Jerolyn Center, PT Pager [phone]   Zena Amos 01/12/2021, 3:18 PM

## 2021-01-12 NOTE — ED Notes (Signed)
Breakfast Ordered 

## 2021-01-12 NOTE — Progress Notes (Signed)
I spoke with pt's son, [name], this afternoon. I updated him on her condition including hallucinations.  She does not have hallucinations at baseline. He says she was completely independent at home, including mowing her lawn, up until breaking her hip and shoulder last year. Since then, she has had a neighbor come in twice a day, ~5h in the morning and 5h at night, to help with cooking and basic household things. She is able to perform ADLs independently.   He spoke with her earlier today and does note that she was talking about a cat running around the room which is not typical for her however does remark that she does typically have some delirium when admitted to the hospital.   I updated him that we will have PT assess her mobility and ability for a safe discharge. Will update him tomorrow.  Elige Radon, MD Internal Medicine Resident PGY-2 [facility] Pager: [phone] 01/12/2021 2:23 PM

## 2021-01-12 NOTE — Evaluation (Signed)
Physical Therapy Evaluation Patient Details Name: Annette Berry MRN: [id number] DOB: [birth year] Today's Date: 01/12/2021   History of Present Illness  79yo person with hypertension who presented to [facility] ED with four days of fever, vomitting and general fatigue. Per EDP, earlier today, family noted that she became increasing somnolent, prompting them to call EMS. +COVID+ hallucinations since hospitalized  Clinical Impression   Pt admitted with above diagnosis. Patient was living alone with a neighbor coming by twice per day to assist her with ADLs. She currently required mod assist to stand, was limited by dizziness (unable to stand long enough to get BP, however was not orthostatic with supine to sit), and was unable to walk with 1 person assist.  Patient on room air with sats 90-92% at rest and down to 87% with standing. After return to sitting, pt vomited large amount and reports she does this "all the time at home." Pt currently with functional limitations due to the deficits listed below (see PT Problem List). Pt will benefit from skilled PT to increase their independence and safety with mobility to allow discharge to the venue listed below.    Will attempt to see pt 2/18 with 2 person assist to see if she can tolerate walking.      Follow Up Recommendations SNF;Supervision/Assistance - 24 hour (IF family can provide 24/7 assist, could potentially go home)    Equipment Recommendations  Other (comment) (TBD)    Recommendations for Other Services OT consult     Precautions / Restrictions Precautions Precautions: Fall      Mobility  Bed Mobility Overal bed mobility: Needs Assistance Bed Mobility: Supine to Sit;Sit to Supine     Supine to sit: Min assist Sit to supine: Min assist   General bed mobility comments: incr time getting scooted to get feet on the floor once seated upright; assist to raise legs onto bed    Transfers Overall transfer level: Needs  assistance Equipment used: Rolling walker (2 wheeled) Transfers: Sit to/from Stand Sit to Stand: Mod assist         General transfer comment: posterior lean with legs braced against bedframe. Did not stand long enough to get orthostatic BP (seated was up from supine BP)  Ambulation/Gait             General Gait Details: unable +1  Stairs            Wheelchair Mobility    Modified Rankin (Stroke Patients Only)       Balance Overall balance assessment: Needs assistance Sitting-balance support: Bilateral upper extremity supported;Feet supported Sitting balance-Leahy Scale: Poor   Postural control: Posterior lean Standing balance support: Bilateral upper extremity supported Standing balance-Leahy Scale: Poor                               Pertinent Vitals/Pain Pain Assessment: No/denies pain    Home Living Family/patient expects to be discharged to:: Private residence Living Arrangements: Alone Available Help at Discharge: Neighbor;Family;Available PRN/intermittently Type of Home: House Home Access: Ramped entrance     Home Layout: One level Home Equipment: Krystal Clark - 2 wheels      Prior Function Level of Independence: Needs assistance   Gait / Transfers Assistance Needed: walks with RW  ADL's / Homemaking Assistance Needed: neighbor comes by in a.m. and pm to assist with ADLs        Hand Dominance  Extremity/Trunk Assessment   Upper Extremity Assessment Upper Extremity Assessment: Generalized weakness    Lower Extremity Assessment Lower Extremity Assessment: Generalized weakness    Cervical / Trunk Assessment Cervical / Trunk Assessment: Kyphotic  Communication   Communication: HOH  Cognition Arousal/Alertness: Awake/alert Behavior During Therapy: WFL for tasks assessed/performed Overall Cognitive Status: Impaired/Different from baseline                                 General Comments:  per RN, pt has been seeing cats and her son in her room today      General Comments General comments (skin integrity, edema, etc.): After sitting back down, pt reports she is going to throw up and she did. RN in to assist with cleaning pt up and return to bed.    Exercises     Assessment/Plan    PT Assessment Patient needs continued PT services  PT Problem List Decreased strength;Decreased activity tolerance;Decreased balance;Decreased mobility;Decreased cognition;Decreased knowledge of use of DME       PT Treatment Interventions DME instruction;Gait training;Functional mobility training;Therapeutic activities;Therapeutic exercise;Balance training;Cognitive remediation;Patient/family education    PT Goals (Current goals can be found in the Care Plan section)  Acute Rehab PT Goals Patient Stated Goal: go home now PT Goal Formulation: With patient Time For Goal Achievement: 01/26/21 Potential to Achieve Goals: Good    Frequency Min 3X/week   Barriers to discharge Decreased caregiver support      Co-evaluation               AM-PAC PT "6 Clicks" Mobility  Outcome Measure Help needed turning from your back to your side while in a flat bed without using bedrails?: A Little Help needed moving from lying on your back to sitting on the side of a flat bed without using bedrails?: A Little Help needed moving to and from a bed to a chair (including a wheelchair)?: A Lot Help needed standing up from a chair using your arms (e.g., wheelchair or bedside chair)?: A Lot Help needed to walk in hospital room?: Total Help needed climbing 3-5 steps with a railing? : Total 6 Click Score: 12    End of Session Equipment Utilized During Treatment: Gait belt Activity Tolerance: Treatment limited secondary to medical complications (Comment) (vomitiing; dizziness) Patient left: in bed;with call bell/phone within reach;with bed alarm set Nurse Communication: Mobility status;Other (comment)  (dizzy but supine to sit not orthostatic) PT Visit Diagnosis: Difficulty in walking, not elsewhere classified (R26.2);Muscle weakness (generalized) (M62.81)    Time: 3953-2023 PT Time Calculation (min) (ACUTE ONLY): 27 min   Charges:   PT Evaluation $PT Eval Moderate Complexity: 1 Mod PT Treatments $Therapeutic Activity: 8-22 mins         Jerolyn Center, PT Pager [phone]   Zena Amos 01/12/2021, 5:11 PM

## 2021-01-12 NOTE — Progress Notes (Incomplete)
   Subjective: ***  Objective:  Vital signs in last 24 hours: Vitals:   01/12/21 0345 01/12/21 0400 01/12/21 0445 01/12/21 0549  BP: (!) 197/76 (!) 192/96 (!) 160/100   Pulse: 90 95 97   Resp: 17 18 (!) 22   Temp:    99 F (37.2 C)  TempSrc:    Oral  SpO2: 90% 90% 90%   Weight:      Height:       ***  Assessment/Plan:  Active Problems:   COVID-19  *** Prior to Admission Living Arrangement: Anticipated Discharge Location: Barriers to Discharge: Dispo: Anticipated discharge in approximately *** day(s).   Evlyn Kanner, MD 01/12/2021, 5:54 AM Pager: @[phone] .[phone] After 5pm on weekdays and 1pm on weekends: On Call pager [phone]

## 2021-01-12 NOTE — ED Notes (Signed)
Pt repositioned

## 2021-01-12 NOTE — Progress Notes (Signed)
Internal Medicine Attending Note:  Patient seen this afternoon on rounds. She was sitting up in bed eating breakfast. She reports feeling like a "new woman". Believes her son is currently in the room. RN reports she was seeing cats earlier.   Blood pressure 125/89, pulse 98, temperature 98.8 F (37.1 C), temperature source Oral, resp. rate 19, height 5\' 4"  (1.626 m), weight 63.5 kg, SpO2 92 %. Physical Exam Constitutional: NAD, appears comfortable HEENT: Moist mucous membranes, normal oropharynx  Cardiovascular: RRR, no murmurs, rubs, or gallops.  Pulmonary/Chest: CTAB, no wheezes, rales, or rhonchi.  Abdominal: Soft, non tender, non distended. +BS.  Extremities: Warm and well perfused. Distal pulses intact. No edema.  Psychiatric: Alert to person place and time but hallucinating. Believes her son is in the room.   Patient is a 80 yo F with pmhx of HTN who presented with fever, vomiting, and AMS. Family called 911 to bring her to the hospital, found to be COVID-19 positive.   COVID-19 Infection: Generalized non specific symptoms but much improved today. Some cough but non productive and lungs are clear on exam. CXR without infiltrate. Oxygen is 92% on RA.  -- PT consult, ambulatory pulse ox if able  -- Appears euvolemic, tolerating PO, ok to d/c fluids -- Stop decadron with acute hallucinations since she is oxygenating ok  -- Continue remdesivir for now, can d/c early if planning for discharge   Acute Metabolic Encephalopathy: Suspect 2/2 to above. Having new hallucinations today, very different from her baseline per family. Question underlying dementia as well. QTc looks ok; can use prn haldol if needed.  -- Delirium precautions   Deconditioning: Very weak, has not yet tried to stand. Sounds like she has not been doing well at home. Believes that she has a nursing aid that she pays to help her every day, but according to her son this is just her neighbor who comes over to help. Anticipate  she will need SNF.   HTN: Not on any medications, uncontrolled here. Renal function is normal.  -- Start losartan 25   76, MD 01/12/2021, 1:54 PM

## 2021-01-13 DIAGNOSIS — R410 Disorientation, unspecified: Secondary | ICD-10-CM

## 2021-01-13 LAB — COMPREHENSIVE METABOLIC PANEL
ALT: 15 U/L (ref 0–44)
AST: 25 U/L (ref 15–41)
Albumin: 3.4 g/dL — ABNORMAL LOW (ref 3.5–5.0)
Alkaline Phosphatase: 54 U/L (ref 38–126)
Anion gap: 12 (ref 5–15)
BUN: 11 mg/dL (ref 8–23)
CO2: 26 mmol/L (ref 22–32)
Calcium: 8.7 mg/dL — ABNORMAL LOW (ref 8.9–10.3)
Chloride: 98 mmol/L (ref 98–111)
Creatinine, Ser: 0.71 mg/dL (ref 0.44–1.00)
GFR, Estimated: >60 mL/min (ref >60)
Glucose, Bld: 105 mg/dL — ABNORMAL HIGH (ref 70–99)
Potassium: 3.4 mmol/L — ABNORMAL LOW (ref 3.5–5.1)
Sodium: 136 mmol/L (ref 135–145)
Total Bilirubin: 0.3 mg/dL (ref 0.3–1.2)
Total Protein: 6.4 g/dL — ABNORMAL LOW (ref 6.5–8.1)

## 2021-01-13 LAB — URINE CULTURE

## 2021-01-13 LAB — CBC WITH DIFFERENTIAL/PLATELET
Abs Immature Granulocytes: 0.01 10*3/uL (ref 0.00–0.07)
Basophils Absolute: 0 10*3/uL (ref 0.0–0.1)
Basophils Relative: 1 %
Eosinophils Absolute: 0 10*3/uL (ref 0.0–0.5)
Eosinophils Relative: 0 %
HCT: 43.4 % (ref 36.0–46.0)
Hemoglobin: 14.7 g/dL (ref 12.0–15.0)
Immature Granulocytes: 0 %
Lymphocytes Relative: 27 %
Lymphs Abs: 1.2 10*3/uL (ref 0.7–4.0)
MCH: 29.8 pg (ref 26.0–34.0)
MCHC: 33.9 g/dL (ref 30.0–36.0)
MCV: 87.9 fL (ref 80.0–100.0)
Monocytes Absolute: 0.6 10*3/uL (ref 0.1–1.0)
Monocytes Relative: 13 %
Neutro Abs: 2.6 10*3/uL (ref 1.7–7.7)
Neutrophils Relative %: 59 %
Platelets: 222 10*3/uL (ref 150–400)
RBC: 4.94 MIL/uL (ref 3.87–5.11)
RDW: 13 % (ref 11.5–15.5)
WBC: 4.4 10*3/uL (ref 4.0–10.5)
nRBC: 0 % (ref 0.0–0.2)

## 2021-01-13 LAB — TSH: TSH: 1.059 u[IU]/mL (ref 0.350–4.500)

## 2021-01-13 LAB — GLUCOSE, CAPILLARY
Glucose-Capillary: 78 mg/dL (ref 70–99)
Glucose-Capillary: 121 mg/dL — ABNORMAL HIGH (ref 70–99)

## 2021-01-13 LAB — MAGNESIUM: Magnesium: 2 mg/dL (ref 1.7–2.4)

## 2021-01-13 LAB — FERRITIN: Ferritin: 98 ng/mL (ref 11–307)

## 2021-01-13 LAB — C-REACTIVE PROTEIN: CRP: 0.7 mg/dL (ref <1.0)

## 2021-01-13 LAB — D-DIMER, QUANTITATIVE: D-Dimer, Quant: 0.52 ug/mL-FEU — ABNORMAL HIGH (ref 0.00–0.50)

## 2021-01-13 LAB — PHOSPHORUS: Phosphorus: 3.9 mg/dL (ref 2.5–4.6)

## 2021-01-13 MED ORDER — POTASSIUM CHLORIDE 20 MEQ PO PACK: 40.0000 meq | PACK | Freq: Two times a day (BID) | ORAL | Status: DC

## 2021-01-13 MED ORDER — POTASSIUM CHLORIDE 20 MEQ PO PACK
40.0000 meq | PACK | Freq: Once | ORAL | Status: AC
  Administered 2021-01-13: 40 meq via ORAL
  Filled 2021-01-13: qty 2

## 2021-01-13 NOTE — Evaluation (Signed)
Occupational Therapy Evaluation Patient Details Name: Annette Berry MRN: [id number] DOB: 05/10/1941 Today's Date: 01/13/2021    History of Present Illness 80yo person with hypertension who presented to [facility] ED with four days of fever, vomitting and general fatigue. Per EDP, earlier today, family noted that she became increasing somnolent, prompting them to call EMS. +COVID+ hallucinations since hospitalized   Clinical Impression   Pt admitted with above. She demonstrates the below listed deficits and will benefit from continued OT to maximize safety and independence with BADLs.  Pt presents to OT with generalized weakness, impaired balance, decreased activity tolerance, and impaired cognition. She currently requires supervision/set - up assist - max A for ADLs and min A +2 for safety during functional mobility.  She lived alone previously and had assist from a neighbor 2x/day for ADLs and IADLs.  Recommend SNF.       Follow Up Recommendations  SNF    Equipment Recommendations  None recommended by OT    Recommendations for Other Services       Precautions / Restrictions Precautions Precautions: Fall      Mobility Bed Mobility Overal bed mobility: Needs Assistance Bed Mobility: Supine to Sit;Sit to Supine     Supine to sit: Min assist     General bed mobility comments: assist to lift trunk    Transfers Overall transfer level: Needs assistance Equipment used: Rolling walker (2 wheeled) Transfers: Sit to/from [employer] Sit to Stand: Min assist;+2 physical assistance;+2 safety/equipment Stand pivot transfers: Min assist;+2 safety/equipment       General transfer comment: Pt requires assist to boost into standing and assist to steady    Balance Overall balance assessment: Needs assistance Sitting-balance support: Bilateral upper extremity supported;Feet supported Sitting balance-Leahy Scale: Fair   Postural control: Posterior  lean Standing balance support: Bilateral upper extremity supported Standing balance-Leahy Scale: Poor                             ADL either performed or assessed with clinical judgement   ADL Overall ADL's : Needs assistance/impaired Eating/Feeding: Set up;Sitting   Grooming: Wash/dry hands;Wash/dry face;Brushing hair;Supervision/safety;Set up;Sitting   Upper Body Bathing: Sitting;Minimal assistance   Lower Body Bathing: Sit to/from stand;Minimal assistance Lower Body Bathing Details (indicate cue type and reason): assist for LEs distally below knees Upper Body Dressing : Moderate assistance;Sitting   Lower Body Dressing: Maximal assistance;Sit to/from stand Lower Body Dressing Details (indicate cue type and reason): pt easily distracted and requires frequent redirection to the task she was attempting to perform Toilet Transfer: Minimal assistance;+2 for safety/equipment;Stand-pivot;Ambulation;BSC Toilet Transfer Details (indicate cue type and reason): pt incontinent of urine and was assisted with clean up Toileting- Clothing Manipulation and Hygiene: Minimal assistance;Sit to/from stand Toileting - Clothing Manipulation Details (indicate cue type and reason): assist for balance in standing     Functional mobility during ADLs: Minimal assistance;+2 for safety/equipment;+2 for physical assistance       Vision         Perception     Praxis      Pertinent Vitals/Pain Pain Assessment: No/denies pain     Hand Dominance Right   Extremity/Trunk Assessment Upper Extremity Assessment Upper Extremity Assessment: Generalized weakness   Lower Extremity Assessment Lower Extremity Assessment: Generalized weakness   Cervical / Trunk Assessment Cervical / Trunk Assessment: Kyphotic   Communication Communication Communication: HOH   Cognition Arousal/Alertness: Awake/alert Behavior During Therapy: WFL for tasks assessed/performed Overall  Cognitive Status:  Impaired/Different from baseline Area of Impairment: Orientation;Attention;Memory;Following commands;Safety/judgement;Problem solving;Awareness                 Orientation Level: Disoriented to;Place;Time;Situation Current Attention Level: Sustained Memory: Decreased short-term memory Following Commands: Follows one step commands consistently Safety/Judgement: Decreased awareness of deficits;Decreased awareness of safety   Problem Solving: Slow processing;Requires verbal cues;Requires tactile cues General Comments: Pt initially not oriented to place stating "i know I am not cone", but later in session, she stated "I am at cone, I remember now".  She follows one step commands consistently with cues.  She requires mod - max cues for safety and to initiate and sustain attention to task.  She frequently freezes mid task requiring cues to redirct her   General Comments  sp02 88-93% on RA with activity.  Pt c/o nausea and dizziness with movement    Exercises     Shoulder Instructions      Home Living Family/patient expects to be discharged to:: Private residence Living Arrangements: Alone Available Help at Discharge: Neighbor;Family;Available PRN/intermittently Type of Home: House Home Access: Ramped entrance     Home Layout: One level     Bathroom Shower/Tub: Producer, television/film/video: Standard Bathroom Accessibility: Yes   Home Equipment: IT sales professional - 2 wheels          Prior Functioning/Environment Level of Independence: Needs assistance  Gait / Transfers Assistance Needed: walks with RW ADL's / Homemaking Assistance Needed: neighbor comes by in a.m. and pm to assist with ADLs            OT Problem List: Decreased strength;Decreased activity tolerance;Impaired balance (sitting and/or standing);Decreased cognition;Decreased safety awareness;Decreased knowledge of use of DME or AE      OT Treatment/Interventions: Self-care/ADL training;Therapeutic  exercise;DME and/or AE instruction;Therapeutic activities;Cognitive remediation/compensation;Patient/family education;Balance training    OT Goals(Current goals can be found in the care plan section) Acute Rehab OT Goals Patient Stated Goal: did not state OT Goal Formulation: With patient Time For Goal Achievement: 01/26/21 Potential to Achieve Goals: Good ADL Goals Pt Will Perform Grooming: (P) with min guard assist;standing Pt Will Perform Upper Body Bathing: (P) with supervision;sitting Pt Will Perform Lower Body Bathing: (P) with min guard assist;sit to/from stand Pt Will Perform Upper Body Dressing: (P) with supervision;with set-up;sitting Pt Will Perform Lower Body Dressing: (P) with min guard assist;sit to/from stand Pt Will Transfer to Toilet: (P) with min guard assist;ambulating;regular height toilet;bedside commode;grab bars Pt Will Perform Toileting - Clothing Manipulation and hygiene: (P) with min guard assist;sit to/from stand  OT Frequency: Min 2X/week   Barriers to D/C:            Co-evaluation PT/OT/SLP Co-Evaluation/Treatment: Yes Reason for Co-Treatment: For patient/therapist safety;Necessary to address cognition/behavior during functional activity;To address functional/ADL transfers   OT goals addressed during session: ADL's and self-care      AM-PAC OT "6 Clicks" Daily Activity     Outcome Measure Help from another person eating meals?: A Little Help from another person taking care of personal grooming?: A Little Help from another person toileting, which includes using toliet, bedpan, or urinal?: A Lot Help from another person bathing (including washing, rinsing, drying)?: A Lot Help from another person to put on and taking off regular upper body clothing?: A Lot Help from another person to put on and taking off regular lower body clothing?: A Lot 6 Click Score: 14   End of Session Equipment Utilized During Treatment: Gait belt;Rolling walker  Nurse  Communication: Mobility status  Activity Tolerance: Patient tolerated treatment well Patient left: in chair;with call bell/phone within reach;with chair alarm set;with nursing/sitter in room  OT Visit Diagnosis: Unsteadiness on feet (R26.81);Cognitive communication deficit (R41.841)                Time: 4580-9983 OT Time Calculation (min): 32 min Charges:  OT General Charges $OT Visit: 1 Visit OT Evaluation $OT Eval Moderate Complexity: 1 Mod  Eber Jones., OTR/L Acute Rehabilitation Services Pager [phone] Office [phone]   Jeani Hawking M 01/13/2021, 2:32 PM

## 2021-01-13 NOTE — Progress Notes (Addendum)
   Subjective:  Patient evaluated at bedside this AM. She denies dyspnea, change in chronic cough, fevers, chills, chest pain. She says she has not seen the cats in the room today, as "they must be hiding." Discussed rehabilitation facility, she wishes to discuss with her son further before making a decision.  Objective:  Vital signs in last 24 hours: Vitals:   01/12/21 0950 01/12/21 1539 01/12/21 2002 01/13/21 0443  BP: 125/89 (!) 168/69 (!) 195/87 (!) 126/99  Pulse: 98 92 100 87  Resp: 19 17 19 17   Temp: 98.8 F (37.1 C) 99.2 F (37.3 C) 99.3 F (37.4 C) 98.8 F (37.1 C)  TempSrc: Oral Oral Oral Oral  SpO2: 92% 90% 90% 90%  Weight:      Height:       Physical Exam: General: Elderly, no acute distress, laying in bed CV: Regular rate, rhythm. No m/r/g appreciated. Pulm: Mild expiratory wheezing, unchanged. Normal WOB Extremities: Warm, dry. No pitting edema bilaterally. Neuro: Awake, alert, oriented x4. No focal deficits appreciated. Psych: Normal mood, speech. No current hallucinations  Assessment/Plan: Annette Berry is 80yo person with hypertension admitted 2/16 with COVID-19 infection, currently stable on room air and likely experiencing a degree of hospital delirium.  Active Problems:   COVID-19  #COVID-19 infection Unchanged respiratory status, continue to oxygenate 90-92% on room air. Suspect patient has degree of underlying lung disease with extensive smoking history (>60 pack-year). On exam, lungs with mild wheezing, unchanged from admission. Will continue with remdesivir, trend inflammatory markers. - C/w remdesivir (day 3) - Holding decadron d/t hallucinations  - Daily inflammatory markers - Incentive spirometry, flutter valve  #Delirium vs acute metabolic encephalopathy Patient currently alert and fully oriented. She does endorse some memory issues and hallucinations yesterday with poor insight. Possible patient has hospital delirium, exacerbated by all staff  wearing PPI d/t COVID-19 status. No recent falls per son, metabolic work-up negative for other causes of encephalopathy. The COVID-19 infection could also be contributory. Will continue with delirium precautions and monitor. - Delirium precautions - If needed, haldol PRN  #Deconditioning Patient worked with physical therapy yesterday, unable to ambulate with one person assistance. She did desaturate to 87% upon standing. She is considering SNF, will discuss with her son, 3/16. Appreciate CSW assistance. - SNF v [facility] w/ 24/7 supervision  #Hypertension Renal function stable. Unclear BP readings, as systolic has ranged from 120-190 in chart. Will not titrate further to avoid further decrease in BP. Mildly hypokalemic, will orally replete and monitor daily given ARB started yesterday. - C/w losartan 25mg  qd - Daily BMP  DIET: Regular IVF: n/a DVT PPX: Lovenox BOWEL: Miralax CODE: FULL FAM COM: Discussed with son, [facility], this morning.  Prior to Admission Living Arrangement: Home Anticipated Discharge Location: SNF vs home w/ 24/7 supervision Barriers to Discharge: placement Dispo: Anticipated discharge in approximately 1-3 day(s) pending dispo decision.   , MD 01/13/2021, 6:12 AM Pager: [phone] After 5pm on weekdays and 1pm on weekends: On Call pager [phone]

## 2021-01-13 NOTE — TOC Initial Note (Signed)
Transition of Care [facility]) - Initial/Assessment Note    Patient Details  Name: Annette Berry MRN: [id number] Date of Birth: 1941-10-04  Transition of Care [facility]) CM/SW Contact:    [place] Pink, Student-Social Work Phone Number: 01/13/2021, 2:11 PM  Clinical Narrative:         CSW intern received consult for possible SNF placement at time of discharge. CSW intern spoke with patient's son Annette Berry) due to the patient not being oriented at this time. Annette Berry reported that the patient would need constant care at home given patient's current physical needs and fall risk. [name] expressed understanding of PT recommendation and is agreeable to SNF placement at time of discharge. CSW intern informed Annette Berry that Annette Berry would be the able to accept patient, he was agreeable to [place]. CSW intern discussed insurance authorization process. [name] expressed wanting to call patient to reassure her. Patient has received 2 of the COVID vaccines. [name] expressed being hopeful that patient would regain strength at SNF.             Expected Discharge Plan: Skilled Nursing Facility Barriers to Discharge: Continued Medical Work up   Patient Goals and CMS Choice Patient states their goals for this hospitalization and ongoing recovery are:: to feel better CMS Medicare.gov Compare Post Acute Care list provided to:: Patient Represenative (must comment) Choice offered to / list presented to : Adult Children  Expected Discharge Plan and Services Expected Discharge Plan: Skilled Nursing Facility In-house Referral: Clinical Social Work Discharge Planning Services: CM Consult Post Acute Care Choice: Skilled Nursing Facility Living arrangements for the past 2 months: Single Family Home                                      Prior Living Arrangements/Services Living arrangements for the past 2 months: Single Family Home Lives with:: Self Patient language and need for interpreter reviewed:: Yes Do you  feel safe going back to the place where you live?: Yes      Need for Family Participation in Patient Care: Yes (Comment) Care giver support system in place?: No (comment)   Criminal Activity/Legal Involvement Pertinent to Current Situation/Hospitalization: No - Comment as needed  Activities of Daily Living Home Assistive Devices/Equipment: Annette Berry (specify type) ADL Screening (condition at time of admission) Patient's cognitive ability adequate to safely complete daily activities?: No Is the patient deaf or have difficulty hearing?: Yes Does the patient have difficulty seeing, even when wearing glasses/contacts?: No Does the patient have difficulty concentrating, remembering, or making decisions?: Yes Patient able to express need for assistance with ADLs?: Yes Does the patient have difficulty dressing or bathing?: Yes Independently performs ADLs?: No Communication: Independent with device (comment) (walker) Dressing (OT): Independent Grooming: Needs assistance Is this a change from baseline?: Pre-admission baseline Feeding: Independent Bathing: Needs assistance Is this a change from baseline?: Pre-admission baseline Toileting: Independent In/Out Bed: Independent with device (comment) (walker) Walks in Home: Independent with device (comment) Does the patient have difficulty walking or climbing stairs?: Yes Weakness of Legs: None Weakness of Arms/Hands: None  Permission Sought/Granted Permission sought to share information with : Case Manufacturing systems engineer Permission granted to share information with : Yes, Verbal Permission Granted     Permission granted to share info w AGENCY: [facility]        Emotional Assessment Appearance:: Appears stated age Attitude/Demeanor/Rapport: Unable to Assess Affect (typically observed): Unable to Assess Orientation: :  Oriented to Self,Oriented to Place Alcohol / Substance Use: Not Applicable Psych Involvement: No  (comment)  Admission diagnosis:  Dehydration [E86.0] Weakness [R53.1] COVID [U07.1] COVID-19 [U07.1] Patient Active Problem List   Diagnosis Date Noted  . Acute delirium   . COVID-19 01/11/2021  . Tobacco abuse 09/10/2019  . Fall   . Closed comminuted intertrochanteric fracture of left femur (HCC) 09/07/2019  . Vitamin D deficiency 09/07/2019   PCP:  Kaleen Mask, MD Pharmacy:   [facility] [place], [place] - [address] [place] [address] [place] [address] Phone: [phone] Fax: [phone]     Social Determinants of Health (SDOH) Interventions    Readmission Risk Interventions No flowsheet data found.

## 2021-01-13 NOTE — Progress Notes (Signed)
SATURATION QUALIFICATIONS: (This note is used to comply with regulatory documentation for home oxygen)  Patient Saturations on Room Air at Rest = 92%  Patient Saturations on Room Air while Ambulating = 88%  Patient Saturations on --- Liters of oxygen while Ambulating = N/A  Patient does not require supplemental oxygen with mobility.  Ina Homes, PT, DPT Acute Rehabilitation Services  Pager [phone] Office [phone]

## 2021-01-13 NOTE — TOC Progression Note (Addendum)
Transition of Care [facility]) - Progression Note    Patient Details  Name: Annette Berry MRN: [id number] Date of Birth: 25-Sep-1941  Transition of [facility]) CM/SW Contact  Mearl Latin, LCSW Phone Number: 01/13/2021, 4:12 PM  Clinical Narrative:    4:12pm-[facility] able to accept patient tomorrow. CSW started insurance authorization (Ref# [id number]).   6pm-Insurance approval received, effective 01/13/21-01/17/21.   Expected Discharge Plan: Skilled Nursing Facility Barriers to Discharge: Continued Medical Work up  Expected Discharge Plan and Services Expected Discharge Plan: Skilled Nursing Facility In-house Referral: Clinical Social Work Discharge Planning Services: CM Consult Post Acute Care Choice: Skilled Nursing Facility Living arrangements for the past 2 months: Single Family Home                                       Social Determinants of Health (SDOH) Interventions    Readmission Risk Interventions No flowsheet data found.

## 2021-01-13 NOTE — NC FL2 (Signed)
[place] MEDICAID FL2 LEVEL OF CARE SCREENING TOOL     IDENTIFICATION  Patient Name: Annette Berry Birthdate: 1941/09/22 Sex: female Admission Date (Current Location): 01/11/2021  [facility] and [place] Number:  Producer, television/film/video and Address:  [facility], [address], [place], [place] [address]      Provider Number: [id number]  Attending Physician Name and Address:  Reymundo Poll, MD  Relative Name and Phone Number:  Fareeha, Evon [phone]    Current Level of Care: Hospital Recommended Level of Care: Skilled Nursing Facility Prior Approval Number:    Date Approved/Denied:   PASRR Number: [id number]  Discharge Plan: SNF    Current Diagnoses: Patient Active Problem List   Diagnosis Date Noted  . Acute delirium   . COVID-19 01/11/2021  . Tobacco abuse 09/10/2019  . Fall   . Closed comminuted intertrochanteric fracture of left femur (HCC) 09/07/2019  . Vitamin D deficiency 09/07/2019    Orientation RESPIRATION BLADDER Height & Weight     Self,Place  Normal Continent,External catheter Weight: 140 lb (63.5 kg) Height:  5\' 4"  (162.6 cm)  BEHAVIORAL SYMPTOMS/MOOD NEUROLOGICAL BOWEL NUTRITION STATUS      Continent Diet (see d/c summary)  AMBULATORY STATUS COMMUNICATION OF NEEDS Skin   Extensive Assist Verbally Normal                       Personal Care Assistance Level of Assistance  Bathing,Feeding,Dressing Bathing Assistance: Maximum assistance Feeding assistance: Independent Dressing Assistance: Maximum assistance     Functional Limitations Info  Sight,Hearing,Speech Sight Info: Adequate Hearing Info: Impaired Speech Info: Adequate    SPECIAL CARE FACTORS FREQUENCY  PT (By licensed PT),OT (By licensed OT)     PT Frequency: 5X per week OT Frequency: 5X per week            Contractures Contractures Info: Not present    Additional Factors Info  Code Status,Allergies,Isolation  Precautions Code Status Info: Full Allergies Info: NKA     Isolation Precautions Info: COVID+ 2/16 vaccinatedX2     Current Medications (01/13/2021):  This is the current hospital active medication list Current Facility-Administered Medications  Medication Dose Route Frequency Provider Last Rate Last Admin  . acetaminophen (TYLENOL) tablet 650 mg  650 mg Oral Q6H PRN 01/15/2021, MD      . albuterol (VENTOLIN HFA) 108 (90 Base) MCG/ACT inhaler 2 puff  2 puff Inhalation Q6H Evlyn Kanner, MD   2 puff at 01/13/21 [phone]  . brimonidine (ALPHAGAN) 0.15 % ophthalmic solution 1 drop  1 drop Both Eyes BID 1937, MD   1 drop at 01/13/21 0953  . enoxaparin (LOVENOX) injection 40 mg  40 mg Subcutaneous Q24H 01/15/21, MD   40 mg at 01/12/21 2048  . guaiFENesin-dextromethorphan (ROBITUSSIN DM) 100-10 MG/5ML syrup 10 mL  10 mL Oral Q4H PRN 2049, MD      . latanoprost (XALATAN) 0.005 % ophthalmic solution 1 drop  1 drop Both Eyes QHS Evlyn Kanner, Rylee, MD   1 drop at 01/12/21 2053  . losartan (COZAAR) tablet 25 mg  25 mg Oral Daily 2054, MD   25 mg at 01/13/21 0951  . ondansetron (ZOFRAN) tablet 4 mg  4 mg Oral Q6H PRN 01/15/21, MD       Or  . ondansetron (ZOFRAN) injection 4 mg  4 mg Intravenous Q6H PRN Evlyn Kanner, MD      . polyethylene glycol (MIRALAX /  GLYCOLAX) packet 17 g  17 g Oral Daily Ephriam Knuckles, Rylee, MD   17 g at 01/12/21 1549  . remdesivir 100 mg in sodium chloride 0.9 % 100 mL IVPB  100 mg Intravenous Daily Evlyn Kanner, MD 200 mL/hr at 01/13/21 0953 100 mg at 01/13/21 7622     Discharge Medications: Please see discharge summary for a list of discharge medications.  Relevant Imaging Results:  Relevant Lab Results:   Additional Information SS#:[id number]  Vara Mairena, LCSWA

## 2021-01-13 NOTE — Plan of Care (Signed)
  Problem: Pain Managment: Goal: General experience of comfort will improve Outcome: Progressing   Problem: Education: Goal: Knowledge of General Education information will improve Description: Including pain rating scale, medication(s)/side effects and non-pharmacologic comfort measures Outcome: Not Progressing

## 2021-01-13 NOTE — Plan of Care (Signed)
  Problem: Activity: Goal: Risk for activity intolerance will decrease Outcome: Progressing   Problem: Coping: Goal: Level of anxiety will decrease Outcome: Progressing   Problem: Elimination: Goal: Will not experience complications related to bowel motility Outcome: Progressing   

## 2021-01-13 NOTE — Discharge Summary (Addendum)
Name: Annette Berry MRN: [id number] DOB: 20-Nov-1941 80 y.o. PCP: Annette Mask, MD  Date of Admission: 01/11/2021  3:07 PM Date of Discharge: 01/14/21 Attending Physician: Reymundo Poll, MD   Discharge Diagnosis: 1. COVID-19 infection 2. Acute metabolic encephalopathy 3. Hospital delirium  4. Deconditioning 5. Hypertension  Discharge Medications: Allergies as of 01/14/2021   No Known Allergies     Medication List    STOP taking these medications   nicotine 21 mg/24hr patch Commonly known as: NICODERM CQ - dosed in mg/24 hours     TAKE these medications   acetaminophen 650 MG CR tablet Commonly known as: TYLENOL Take 650-1,300 mg by mouth in the morning and at bedtime.   brimonidine 0.15 % ophthalmic solution Commonly known as: ALPHAGAN Place 1 drop into both eyes 2 (two) times daily.   diclofenac Sodium 1 % Gel Commonly known as: VOLTAREN Apply 4 g topically 4 (four) times daily. What changed:   when to take this  reasons to take this   docusate sodium 100 MG capsule Commonly known as: COLACE Take 100 mg by mouth 2 (two) times daily as needed for mild constipation.   feeding supplement Liqd Take 237 mLs by mouth 2 (two) times daily between meals.   furosemide 80 MG tablet Commonly known as: LASIX Take 80 mg by mouth daily.   latanoprost 0.005 % ophthalmic solution Commonly known as: XALATAN Place 1 drop into both eyes at bedtime.   losartan 25 MG tablet Commonly known as: COZAAR Take 1 tablet (25 mg total) by mouth daily.   polyethylene glycol powder 17 GM/SCOOP powder Commonly known as: MiraLax Take 255 g by mouth daily.   potassium chloride SA 20 MEQ tablet Commonly known as: KLOR-CON Take 1 tablet (20 mEq total) by mouth 2 (two) times daily.   traMADol 50 MG tablet Commonly known as: ULTRAM Take 1 tablet (50 mg total) by mouth every 6 (six) hours as needed for moderate pain.   Vitamin D3 25 MCG tablet Commonly  known as: Vitamin D Take 2 tablets (2,000 Units total) by mouth 2 (two) times daily.       Disposition and follow-up:   Annette Berry was discharged from [facility] in Stable condition.  At the hospital follow up visit please address:  1. COVID-19 infection: Patient remained on room air throughout hospitalization.  Vaccinated with 2 doses of Moderna. Did not receive the booster. Discharge recommendations -isolation through 01/21/21 to complete a total of 10 days -recommend she receive a COVID 19 vaccine booster after 90 days  2. Acute metabolic encephalopathy (COVID-19) and hospital delerium:  During hospitalization developed hospital delirium with waxing and waning symptoms, including hallucinations. Possible underlying dementia.  Follow up recommendations -monitor and trend cognitive screening  3. Physical Deconditioning.  Follow up recommendations -continue PT/OT  4. Hypertension: Uncontrolled, not on home antihypertensives. Blood pressures responded well to losartan this admission (started 2/17) Discharge medications: START losartan 25mg  daily, START potassium supplementation BID Follow up recommendations -BMP to check renal function and potassium at time of follow up.  5. Glaucoma. Continue home latanoprost and Bromadrine eye drops.  6. Hx of Vitamin D deficiency. She was on cholecalciferol 2000U BID prior to admission. Will continue this at discharge.  Follow up recommendations -check Vitamin D   2.  Labs / imaging needed at time of follow-up: BMP, vitamin D  3.  Pending labs/ test needing follow-up: none  Follow-up Appointments:  Contact information for  after-discharge care    Destination    [facility] Preferred SNF .   Service: Skilled Nursing Contact information: [address] [place] [address] [phone]                  Hospital Course by problem list: 1. COVID-19 infection Pt presented to  [facility] via EMS on 2/16, after being found minimally responsive by her neighbor. Family reported some nausea and vomiting in the days leading up to admission. Upon EMS arrival, she was initially hypotensive to 80/40. Hypotension resolved with an epinephrine drip via EMS and she received IVF en route to the hospital. She did not have respiratory symptoms prior or during her hospitalization and remained on room air. Sepsis was of initial concern in the ED however she did not meet sepsis criteria and was effectively ruled out. Blood cultures remained without growth on day of discharge. Her GI symptoms resolved.  2. Hospital delirium superimposed on COVID 19 metabolic encephalopathy During her hospitalization, she was noted to have a few instances of halucinations such as cats running around her room or her son sitting beside her. On further discussion with her son, Annette Berry, he notes that this has happened on prior admissions and resolved upon discharging home. Her mental status improved throughout her hospital stay and no hallucinations were reported on day of discharge.  3. Physical deconditioning. Suspect this is multifactorial involving her age and the nausea/vomitting she was experiencing prior to admission. Prior to admission, she was ambulating independently. She did have a hip fracture and shoulder fracture earlier last year which has limited her ability to do things such as mow her grass. She was assessed by physical and occupational therapy this admission. She required assistance to stand and was generally weak. It was recommended that she undergo a period of rehabilitation prior to returning home.  4. Hypertension. Although she was initially hypotensive prior to arrival at the ED, her blood pressure was elevated for the majority of her hospitalization. Her son was aware that she was hypertensive but she had chosen to not take medication in the past. She was started on losartan this  admission which reflected an improvement in her blood pressures. Renal function remained stable throughout her hospitalization. Her potassium was persistently low and she was started on a potassium supplementation  Discharge Subjective: no complaints this morning. Agreeable to discharge to [facility] this afternoon.  Discharge Exam:   BP (!) 164/73 (BP Location: Right Arm)   Pulse 84   Temp 98.5 F (36.9 C) (Oral)   Resp 14   Ht 5\' 4"  (1.626 m)   Wt 63.5 kg   SpO2 98%   BMI 24.03 kg/m  General: elderly appearing in NAD CV: RRR, extremities warm HEENT: presbycusis  Pulm: breathing comfortably on room air, lungs clear GI: not distended, soft Neuro: alert. Answers questions appropriately  Pertinent Labs, Studies, and Procedures:  CBC Latest Ref Rng & Units 01/14/2021 01/13/2021 01/12/2021  WBC 4.0 - 10.5 K/uL 3.1(L) 4.4 3.4(L)  Hemoglobin 12.0 - 15.0 g/dL 01/14/2021 28.4 13.2  Hematocrit 36.0 - 46.0 % 42.1 43.4 41.2  Platelets 150 - 400 K/uL 222 222 202   BMP Latest Ref Rng & Units 01/14/2021 01/13/2021 01/12/2021  Glucose 70 - 99 mg/dL 97 01/14/2021) 102(V)  BUN 8 - 23 mg/dL 16 11 7(L)  Creatinine 0.44 - 1.00 mg/dL 253(G 6.44 0.34  Sodium 135 - 145 mmol/L 137 136 134(L)  Potassium 3.5 - 5.1 mmol/L 3.3(L)  3.4(L) 3.6  Chloride 98 - 111 mmol/L 102 98 99  CO2 22 - 32 mmol/L 25 26 24   Calcium 8.9 - 10.3 mg/dL ) 5.4(D) 8.9   8.2(M CXR: atherosclerotic calcification of aorta. No infiltrate, effusion or pneumothorax. Demineralized bones.   Discharge Instructions: Discharge Instructions    Diet general   Complete by: As directed       Signed: 4/15, MD Internal Medicine Resident PGY-2 [facility] Pager: [phone] 01/14/2021 9:45 AM

## 2021-01-13 NOTE — Progress Notes (Signed)
Physical Therapy Treatment Patient Details Name: Annette Berry MRN: [id number] DOB: 04-12-1941 Today's Date: 01/13/2021    History of Present Illness Pt is a 80 y.o. female admitted 01/11/21 with four days of fever, cough, fatigue and somnolence per family who called EMS. Workup for (+) COVID-19, delirium vs. acute metabolic encephalopathy. PMH includes HTN, arthritis, tobacco use.   PT Comments    Pt progressing with mobility. Pt disoriented, but did not appear to have any hallucinations this session; pt demonstrates decreased attention and slowed processing, requiring frequent cues to redirect to task. Pt requires minA+2 for short mobility distance with RW, increased assist for ADL tasks with multiple LOB. Continue to recommend SNF-level therapies to maximize functional mobility and independence prior to return home.   Follow Up Recommendations  SNF;Supervision/Assistance - 24 hour     Equipment Recommendations   (defer)    Recommendations for Other Services       Precautions / Restrictions Precautions Precautions: Fall;Other (comment) Precaution Comments: Urine incontinence; h/o vomiting daily (per pt) Restrictions Weight Bearing Restrictions: No    Mobility  Bed Mobility Overal bed mobility: Needs Assistance Bed Mobility: Supine to Sit     Supine to sit: Min assist     General bed mobility comments: MinA for trunk elevation, cues to stay on task    Transfers Overall transfer level: Needs assistance Equipment used: Rolling walker (2 wheeled) Transfers: Sit to/from Stand Sit to Stand: Min assist;+2 safety/equipment Stand pivot transfers: Min assist;+2 safety/equipment       General transfer comment: Multiple stands from EOB, BSC and recliner to RW with consistent minA for trunk elevation and stability, +2 for safety/lines  Ambulation/Gait Ambulation/Gait assistance: Min assist;+2 safety/equipment Gait Distance (Feet): 10 Feet Assistive device: Rolling  walker (2 wheeled) Gait Pattern/deviations: Step-to pattern;Trunk flexed;Shuffle Gait velocity: Decreased   General Gait Details: Slow, mildly unsteady gait with RW and minA+2 (safety/lines), pt frequently stopping during task requiring increased time and frequent cues to redirect to task, pt easily distracted   Stairs             Wheelchair Mobility    Modified Rankin (Stroke Patients Only)       Balance Overall balance assessment: Needs assistance Sitting-balance support: Feet supported;No upper extremity supported Sitting balance-Leahy Scale: Fair   Postural control: Posterior lean Standing balance support: Bilateral upper extremity supported Standing balance-Leahy Scale: Poor Standing balance comment: Reliant on at least single UE support or external assist to maintain standing balance                            Cognition Arousal/Alertness: Awake/alert Behavior During Therapy: WFL for tasks assessed/performed Overall Cognitive Status: Impaired/Different from baseline Area of Impairment: Orientation;Attention;Memory;Following commands;Safety/judgement;Problem solving;Awareness                 Orientation Level: Disoriented to;Place;Time;Situation Current Attention Level: Sustained Memory: Decreased short-term memory Following Commands: Follows one step commands consistently Safety/Judgement: Decreased awareness of deficits;Decreased awareness of safety Awareness: Intellectual Problem Solving: Slow processing;Requires verbal cues;Requires tactile cues General Comments: Pt initially not oriented to place stating "i know I am not cone", but later in session, she stated "I am at cone, I remember now".  She follows one step commands consistently with cues.  She requires mod - max cues for safety and to initiate and sustain attention to task.  She frequently freezes mid task requiring cues to redirct her      Exercises  General Comments General  comments (skin integrity, edema, etc.): SpO2 >/88% on RA with activity, HR 90s-100s. Pt c/o nausea and dizziness with mobility, no vomiting this session      Pertinent Vitals/Pain Pain Assessment: No/denies pain    Home Living Family/patient expects to be discharged to:: Private residence Living Arrangements: Alone Available Help at Discharge: Neighbor;Family;Available PRN/intermittently Type of Home: House Home Access: Ramped entrance   Home Layout: One level Home Equipment: Krystal Clark - 2 wheels      Prior Function Level of Independence: Needs assistance  Gait / Transfers Assistance Needed: walks with RW ADL's / Homemaking Assistance Needed: neighbor comes by in a.m. and pm to assist with ADLs     PT Goals (current goals can now be found in the care plan section) Acute Rehab PT Goals Patient Stated Goal: did not state Progress towards PT goals: Progressing toward goals    Frequency    Min 2X/week      PT Plan Frequency needs to be updated    Co-evaluation PT/OT/SLP Co-Evaluation/Treatment: Yes Reason for Co-Treatment: Necessary to address cognition/behavior during functional activity;For patient/therapist safety;To address functional/ADL transfers PT goals addressed during session: Mobility/safety with mobility;Balance;Proper use of DME OT goals addressed during session: ADL's and self-care      AM-PAC PT "6 Clicks" Mobility   Outcome Measure  Help needed turning from your back to your side while in a flat bed without using bedrails?: A Little Help needed moving from lying on your back to sitting on the side of a flat bed without using bedrails?: A Little Help needed moving to and from a bed to a chair (including a wheelchair)?: A Little Help needed standing up from a chair using your arms (e.g., wheelchair or bedside chair)?: A Little Help needed to walk in hospital room?: A Little Help needed climbing 3-5 steps with a railing? : A Lot 6 Click Score:  17    End of Session Equipment Utilized During Treatment: Gait belt Activity Tolerance: Patient tolerated treatment well Patient left: in chair;with call bell/phone within reach;with chair alarm set;with nursing/sitter in room Nurse Communication: Mobility status PT Visit Diagnosis: Difficulty in walking, not elsewhere classified (R26.2);Muscle weakness (generalized) (M62.81)     Time: 6314-9702 PT Time Calculation (min) (ACUTE ONLY): 32 min  Charges:  $Therapeutic Activity: 8-22 mins                     Ina Homes, PT, DPT Acute Rehabilitation Services  Pager [phone] Office [phone]  Malachy Chamber 01/13/2021, 4:26 PM

## 2021-01-14 DIAGNOSIS — U071 COVID-19: Principal | ICD-10-CM

## 2021-01-14 LAB — CBC WITH DIFFERENTIAL/PLATELET
Abs Immature Granulocytes: 0.01 10*3/uL (ref 0.00–0.07)
Basophils Absolute: 0 10*3/uL (ref 0.0–0.1)
Basophils Relative: 0 %
Eosinophils Absolute: 0 10*3/uL (ref 0.0–0.5)
Eosinophils Relative: 1 %
HCT: 42.1 % (ref 36.0–46.0)
Hemoglobin: 14.9 g/dL (ref 12.0–15.0)
Immature Granulocytes: 0 %
Lymphocytes Relative: 39 %
Lymphs Abs: 1.2 10*3/uL (ref 0.7–4.0)
MCH: 30.8 pg (ref 26.0–34.0)
MCHC: 35.4 g/dL (ref 30.0–36.0)
MCV: 87.2 fL (ref 80.0–100.0)
Monocytes Absolute: 0.5 10*3/uL (ref 0.1–1.0)
Monocytes Relative: 17 %
Neutro Abs: 1.3 10*3/uL — ABNORMAL LOW (ref 1.7–7.7)
Neutrophils Relative %: 43 %
Platelets: 222 10*3/uL (ref 150–400)
RBC: 4.83 MIL/uL (ref 3.87–5.11)
RDW: 13.2 % (ref 11.5–15.5)
WBC: 3.1 10*3/uL — ABNORMAL LOW (ref 4.0–10.5)
nRBC: 0 % (ref 0.0–0.2)

## 2021-01-14 LAB — MAGNESIUM: Magnesium: 1.9 mg/dL (ref 1.7–2.4)

## 2021-01-14 LAB — COMPREHENSIVE METABOLIC PANEL
ALT: 15 U/L (ref 0–44)
AST: 26 U/L (ref 15–41)
Albumin: 3.2 g/dL — ABNORMAL LOW (ref 3.5–5.0)
Alkaline Phosphatase: 50 U/L (ref 38–126)
Anion gap: 10 (ref 5–15)
BUN: 16 mg/dL (ref 8–23)
CO2: 25 mmol/L (ref 22–32)
Calcium: 8.7 mg/dL — ABNORMAL LOW (ref 8.9–10.3)
Chloride: 102 mmol/L (ref 98–111)
Creatinine, Ser: 0.68 mg/dL (ref 0.44–1.00)
GFR, Estimated: >60 mL/min (ref >60)
Glucose, Bld: 97 mg/dL (ref 70–99)
Potassium: 3.3 mmol/L — ABNORMAL LOW (ref 3.5–5.1)
Sodium: 137 mmol/L (ref 135–145)
Total Bilirubin: 0.5 mg/dL (ref 0.3–1.2)
Total Protein: 5.8 g/dL — ABNORMAL LOW (ref 6.5–8.1)

## 2021-01-14 LAB — C-REACTIVE PROTEIN: CRP: 0.8 mg/dL (ref <1.0)

## 2021-01-14 LAB — D-DIMER, QUANTITATIVE: D-Dimer, Quant: 0.44 ug/mL-FEU (ref 0.00–0.50)

## 2021-01-14 LAB — FERRITIN: Ferritin: 115 ng/mL (ref 11–307)

## 2021-01-14 LAB — PHOSPHORUS: Phosphorus: 4.2 mg/dL (ref 2.5–4.6)

## 2021-01-14 MED ORDER — POTASSIUM CHLORIDE CRYS ER 20 MEQ PO TBCR
20.0000 meq | EXTENDED_RELEASE_TABLET | Freq: Two times a day (BID) | ORAL | Status: DC
  Administered 2021-01-14: 20 meq via ORAL
  Filled 2021-01-14: qty 1

## 2021-01-14 MED ORDER — TRAMADOL HCL 50 MG PO TABS: 50.0000 mg | ORAL_TABLET | Freq: Four times a day (QID) | ORAL | 0 refills | Status: DC | PRN

## 2021-01-14 MED ORDER — LOSARTAN POTASSIUM 25 MG PO TABS
25.0000 mg | ORAL_TABLET | Freq: Every day | ORAL | Status: AC
Start: 2021-01-14 — End: ?

## 2021-01-14 MED ORDER — POTASSIUM CHLORIDE CRYS ER 20 MEQ PO TBCR: 20.0000 meq | EXTENDED_RELEASE_TABLET | Freq: Two times a day (BID) | ORAL | Status: DC

## 2021-01-14 NOTE — Progress Notes (Incomplete)
   Subjective:  No significant overnight events. No complaints this morning. Agreeable to discharge to SNF for ongoing rehabilitation later today.  Objective:  Vital signs in last 24 hours: Vitals:   01/13/21 1307 01/13/21 1519 01/13/21 2035 01/14/21 0420  BP:  (!) 154/83 (!) 162/88 (!) 164/73  Pulse:  89 92 84  Resp:  18 18 14   Temp:  98.8 F (37.1 C) 98.8 F (37.1 C) 98.5 F (36.9 C)  TempSrc: Oral Oral Oral Oral  SpO2:   91% 98%  Weight:      Height:       Physical Exam: General: elderly appearing, in NAD Cardiac: RRR Pulm: breathing comfortably on room air, lungs clear Abdomen: not distended, soft  Assessment/Plan: Ms. Fennelly is 79yo person with hypertension admitted 2/16 for acute onset nausea/vomitting, weakness, and altered mental status who was found to have COVID 19. She is stable to discharge to [facility] later today for ongoing rehabilitation.  Principal Problem:   COVID-19 Active Problems:   Acute delirium  Pt is medically stable for discharge to [facility] for ongoing physical therapy. Please see Discharge Summary.  #COVID-19 infection. Remains without respiratory symptoms. No n/v/diarrhea since admission. -will need to remain in isolation through 2/26  #Hospital delirium vs metabolic encephalopathy 2/2 COVID 19 - Delirium precautions  #Deconditioning - discharge to [facility] today  #Hypertension - C/w losartan 25mg  qd  DIET: Regular IVF: n/a DVT PPX: Lovenox BOWEL: Miralax CODE: FULL FAM COM: Discussed with son, [facility], this morning.  Prior to Admission Living Arrangement: Home Anticipated Discharge Location: SNF vs home w/ 24/7 supervision Barriers to Discharge: placement Dispo: Anticipated discharge in approximately 1-3 day(s) pending dispo decision.   , MD Internal Medicine Resident PGY-2 [name] [facility] Res[facility] Pager: [phone] 01/14/2021 9:27 AM    After 5pm on weekdays and 1pm on weekends:  On Call pager [phone]

## 2021-01-14 NOTE — Plan of Care (Signed)
  Problem: Education: Goal: Knowledge of General Education information will improve Description: Including pain rating scale, medication(s)/side effects and non-pharmacologic comfort measures 01/14/2021 1213 by Clyde Canterbury, RN Outcome: Adequate for Discharge 01/14/2021 1140 by Clyde Canterbury, RN Outcome: Progressing   Problem: Health Behavior/Discharge Planning: Goal: Ability to manage health-related needs will improve Outcome: Adequate for Discharge   Problem: Clinical Measurements: Goal: Ability to maintain clinical measurements within normal limits will improve Outcome: Adequate for Discharge Goal: Will remain free from infection 01/14/2021 1213 by Clyde Canterbury, RN Outcome: Adequate for Discharge 01/14/2021 1140 by Clyde Canterbury, RN Outcome: Progressing Goal: Diagnostic test results will improve Outcome: Adequate for Discharge Goal: Respiratory complications will improve 01/14/2021 1213 by Clyde Canterbury, RN Outcome: Adequate for Discharge 01/14/2021 1140 by Clyde Canterbury, RN Outcome: Progressing Goal: Cardiovascular complication will be avoided Outcome: Adequate for Discharge   Problem: Activity: Goal: Risk for activity intolerance will decrease 01/14/2021 1213 by Clyde Canterbury, RN Outcome: Adequate for Discharge 01/14/2021 1140 by Clyde Canterbury, RN Outcome: Progressing   Problem: Coping: Goal: Level of anxiety will decrease Outcome: Adequate for Discharge   Problem: Elimination: Goal: Will not experience complications related to bowel motility 01/14/2021 1213 by Clyde Canterbury, RN Outcome: Adequate for Discharge 01/14/2021 1140 by Clyde Canterbury, RN Outcome: Progressing   Problem: Pain Managment: Goal: General experience of comfort will improve 01/14/2021 1213 by Clyde Canterbury, RN Outcome: Adequate for Discharge 01/14/2021 1140 by Clyde Canterbury, RN Outcome: Progressing   Problem: Safety: Goal: Ability to remain free from injury will  improve 01/14/2021 1213 by Clyde Canterbury, RN Outcome: Adequate for Discharge 01/14/2021 1140 by Clyde Canterbury, RN Outcome: Progressing   Problem: Skin Integrity: Goal: Risk for impaired skin integrity will decrease 01/14/2021 1213 by Clyde Canterbury, RN Outcome: Adequate for Discharge 01/14/2021 1140 by Clyde Canterbury, RN Outcome: Progressing   Problem: Education: Goal: Knowledge of risk factors and measures for prevention of condition will improve Outcome: Adequate for Discharge   Problem: Coping: Goal: Psychosocial and spiritual needs will be supported Outcome: Adequate for Discharge   Problem: Respiratory: Goal: Will maintain a patent airway 01/14/2021 1213 by Clyde Canterbury, RN Outcome: Adequate for Discharge 01/14/2021 1140 by Clyde Canterbury, RN Outcome: Progressing Goal: Complications related to the disease process, condition or treatment will be avoided or minimized 01/14/2021 1213 by Clyde Canterbury, RN Outcome: Adequate for Discharge 01/14/2021 1140 by Clyde Canterbury, RN Outcome: Progressing   Problem: Education: Goal: Knowledge of risk factors and measures for prevention of condition will improve Outcome: Adequate for Discharge   Problem: Coping: Goal: Psychosocial and spiritual needs will be supported Outcome: Adequate for Discharge   Problem: Respiratory: Goal: Will maintain a patent airway 01/14/2021 1213 by Clyde Canterbury, RN Outcome: Adequate for Discharge 01/14/2021 1140 by Clyde Canterbury, RN Outcome: Progressing Goal: Complications related to the disease process, condition or treatment will be avoided or minimized 01/14/2021 1213 by Clyde Canterbury, RN Outcome: Adequate for Discharge 01/14/2021 1140 by Clyde Canterbury, RN Outcome: Progressing

## 2021-01-14 NOTE — Progress Notes (Signed)
Patient discharged from facility via transport to [facility] at this time. All patient belongings in disposition of the patient. All IV access' discontinued, catheter intact, pressure dressing applied. All discharge instructions reviewed with patient. All questions answered. No further needs at this time.

## 2021-01-14 NOTE — Progress Notes (Signed)
Report called to [facility] [employer]. Spoke with Reuel Boom, LPN

## 2021-01-14 NOTE — Plan of Care (Signed)
  Problem: Education: Goal: Knowledge of General Education information will improve Description: Including pain rating scale, medication(s)/side effects and non-pharmacologic comfort measures Outcome: Progressing   Problem: Clinical Measurements: Goal: Will remain free from infection Outcome: Progressing Goal: Respiratory complications will improve Outcome: Progressing   Problem: Activity: Goal: Risk for activity intolerance will decrease Outcome: Progressing   Problem: Elimination: Goal: Will not experience complications related to bowel motility Outcome: Progressing   Problem: Pain Managment: Goal: General experience of comfort will improve Outcome: Progressing   Problem: Safety: Goal: Ability to remain free from injury will improve Outcome: Progressing   Problem: Skin Integrity: Goal: Risk for impaired skin integrity will decrease Outcome: Progressing   Problem: Respiratory: Goal: Will maintain a patent airway Outcome: Progressing Goal: Complications related to the disease process, condition or treatment will be avoided or minimized Outcome: Progressing   Problem: Respiratory: Goal: Will maintain a patent airway Outcome: Progressing Goal: Complications related to the disease process, condition or treatment will be avoided or minimized Outcome: Progressing

## 2021-01-14 NOTE — TOC Transition Note (Signed)
Transition of Care [facility]) - CM/SW Discharge Note   Patient Details  Name: Annette Berry MRN: [id number] Date of Birth: [birth year]  Transition of Care [facility] CM/SW Contact:  Mearl Latin, LCSW Phone Number: 01/14/2021, 10:13 AM   Clinical Narrative:    Patient will DC to: [facility] Anticipated DC date: 01/14/21 Family notified: Son, [name] Transport by: Sharin Mons   Per MD patient ready for DC to [place]. RN to call report prior to discharge [phone] Room [address] [place]). RN, patient, patient's family, and facility notified of DC. Discharge Summary and FL2 sent to facility. DC packet on chart. Ambulance transport requested for patient.   CSW will sign off for now as social work intervention is no longer needed. Please consult [place] again if new needs arise.      Final next level of care: Skilled Nursing Facility Barriers to Discharge: Barriers Resolved   Patient Goals and CMS Choice Patient states their goals for this hospitalization and ongoing recovery are:: to feel better CMS Medicare.gov Compare Post Acute Care list provided to:: Patient Represenative (must comment) Choice offered to / list presented to : Adult Children  Discharge Placement   Existing PASRR number confirmed : 01/14/21          Patient [facility] bed at: [facility] Patient to be transferred to facility by: [facility] Name of family member notified: Son, [name] Patient and family notified of of transfer: 01/14/21  Discharge Plan and Services In-house Referral: Clinical Social Work Discharge Planning Services: [employer] Consult Post Acute Care Choice: Skilled Nursing Facility                               Social Determinants of Health (SDOH) Interventions     Readmission Risk Interventions No flowsheet data found.

## 2021-01-16 LAB — CULTURE, BLOOD (ROUTINE X 2)
Culture: NO GROWTH
Culture: NO GROWTH
Special Requests: ADEQUATE

## 2021-12-23 IMAGING — CT CT HEAD W/O CM
4 series · 17 of 47 positions shown, 19 images · non-contrast
Comparison: None.

CLINICAL DATA: 78-year-old female with head trauma.

EXAM:
CT HEAD WITHOUT CONTRAST
TECHNIQUE: Contiguous axial images were obtained from the base of the skull
through the vertex without intravenous contrast.

[Series 3: head without · axial · non-contrast · 0.42mm/px · z∈[+376,+496]mm · 7 of 34 slices shown, 9 images]
[im 5/34  brain]
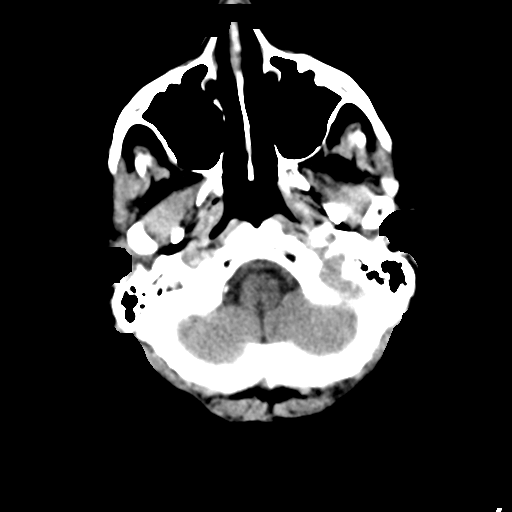
[im 5/34  bone]
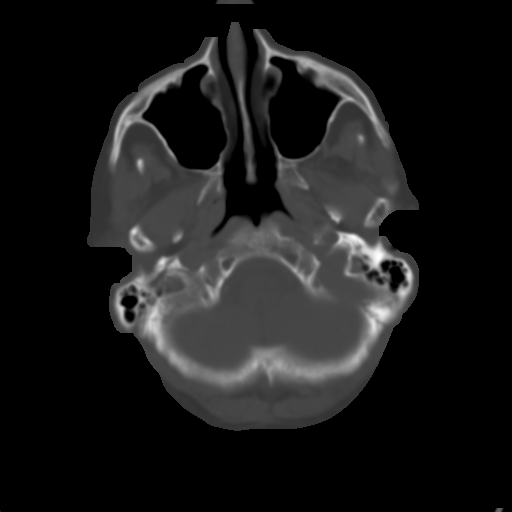
[im 9/34  brain]
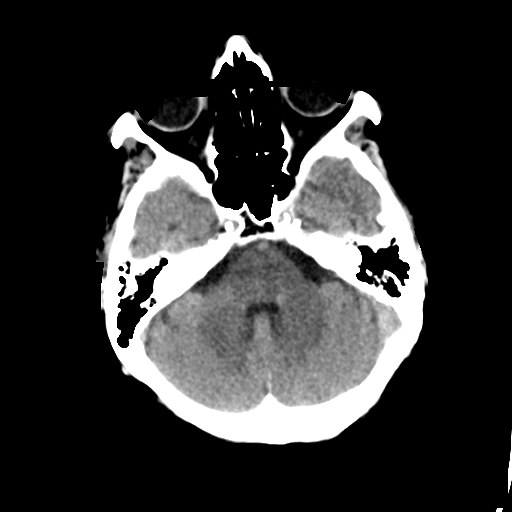
[im 13/34  brain]
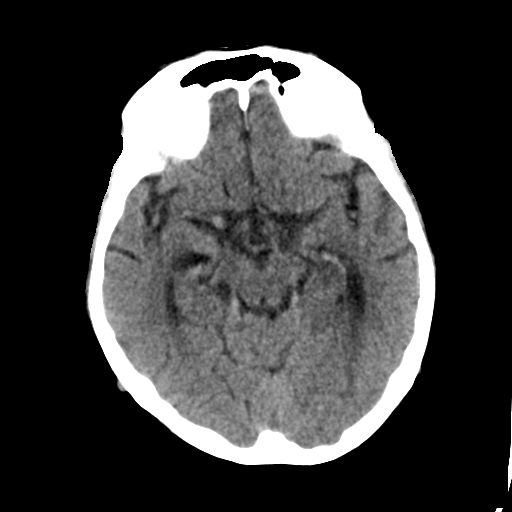
[im 17/34  brain]
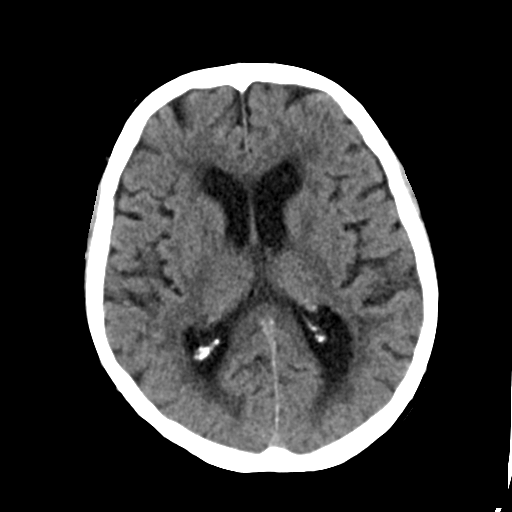
[im 21/34  brain]
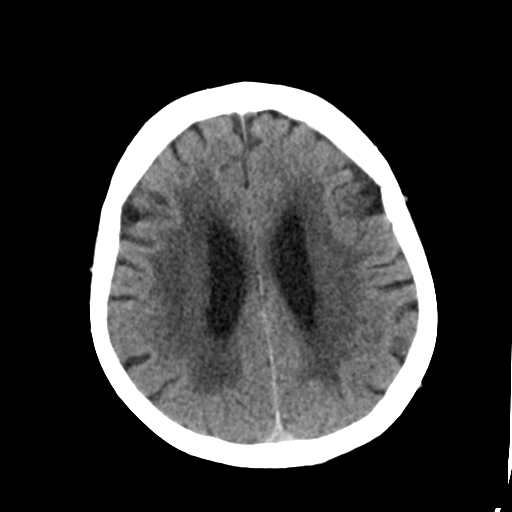
[im 21/34  bone]
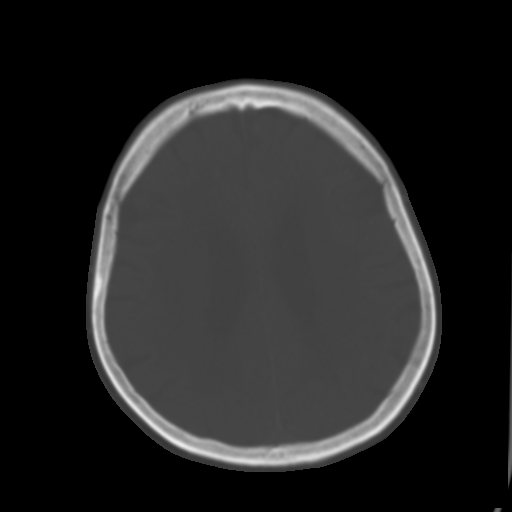
[im 25/34  brain]
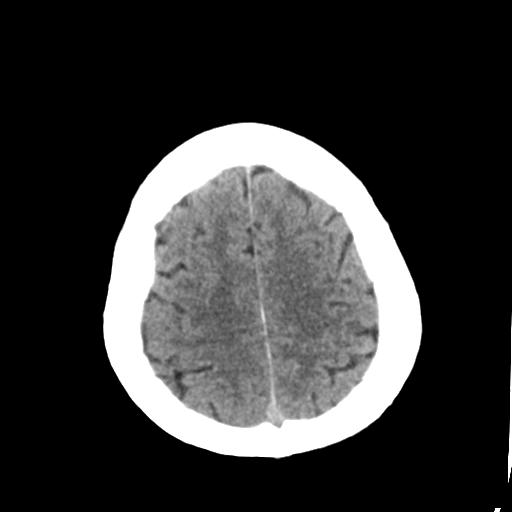
[im 29/34  brain]
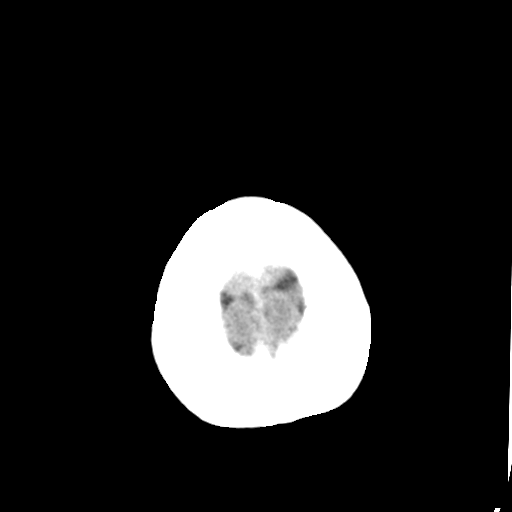

[Series 4: head bone · axial · 0.42mm/px · z∈[+372,+428]mm · 4 of 83 slices shown]
[im 9/83  bone]
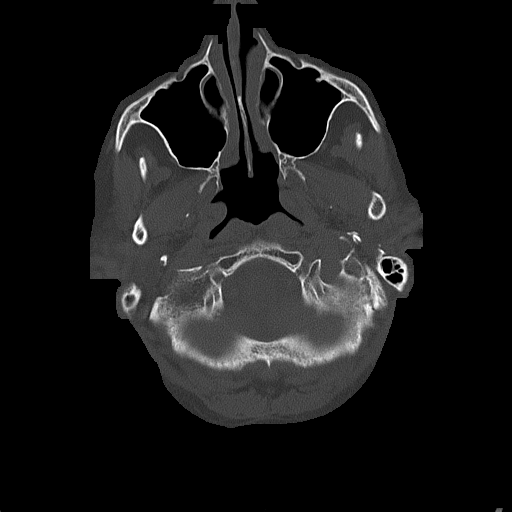
[im 17/83  bone]
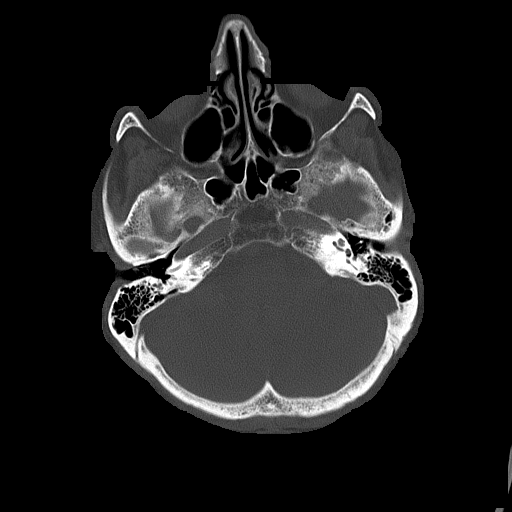
[im 25/83  bone]
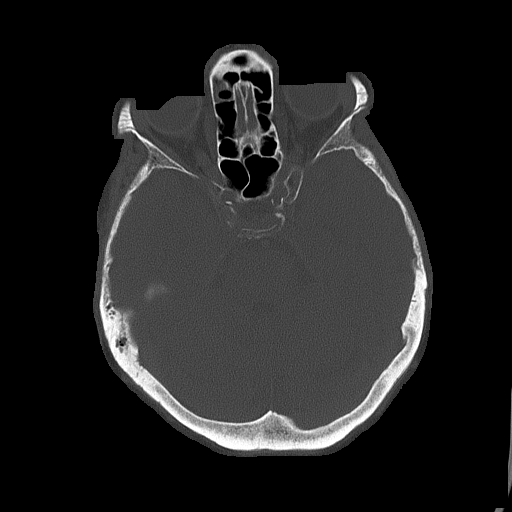
[im 37/83  bone]
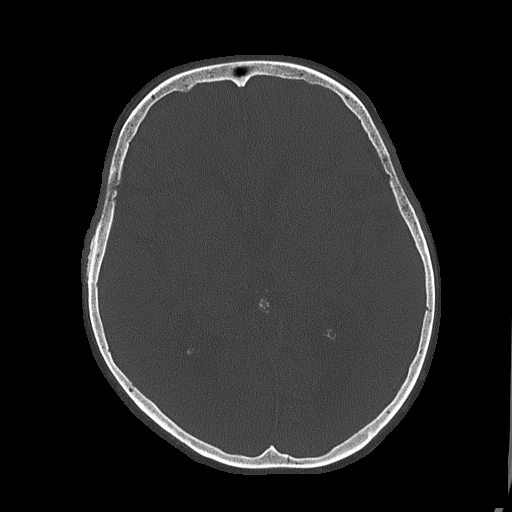

[Series 5: head without cor · coronal · non-contrast · 0.32mm/px · 3 of 67 slices shown]
[im 23/67  brain]
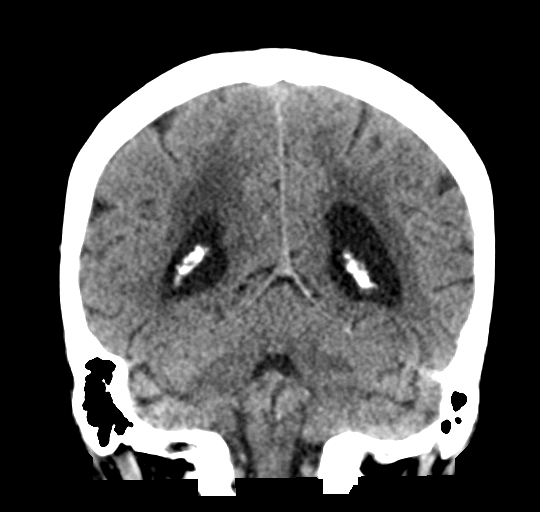
[im 30/67  brain]
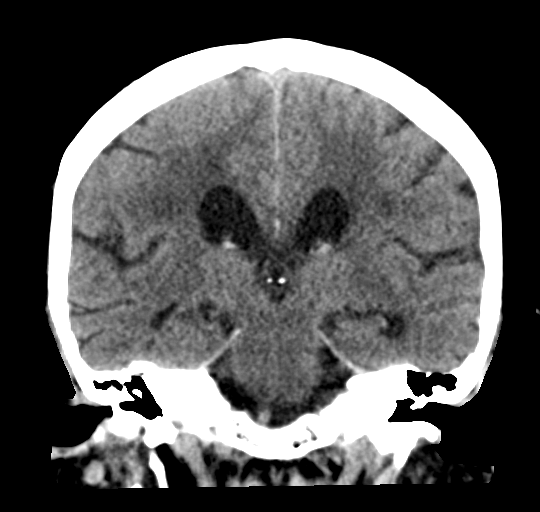
[im 37/67  brain]
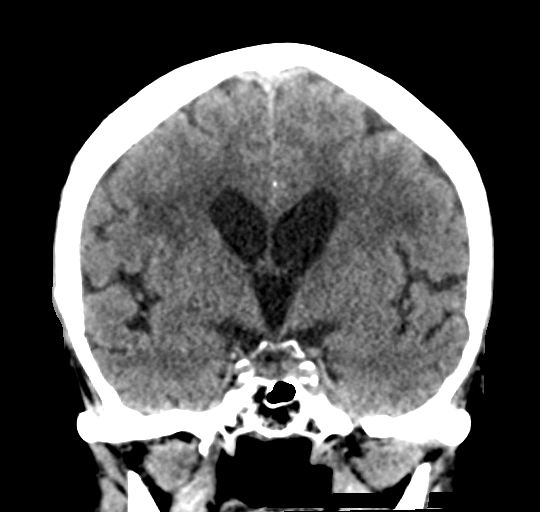

[Series 6: head without sag · sagittal · non-contrast · 0.32mm/px · 3 of 52 slices shown]
[im 18/52  brain]
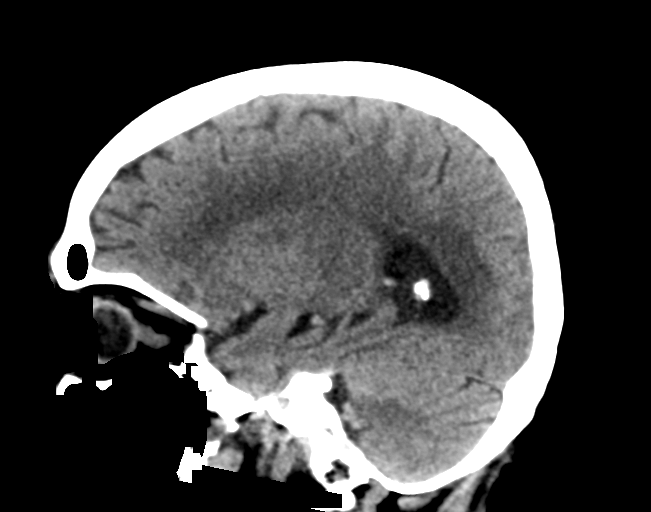
[im 26/52  brain]
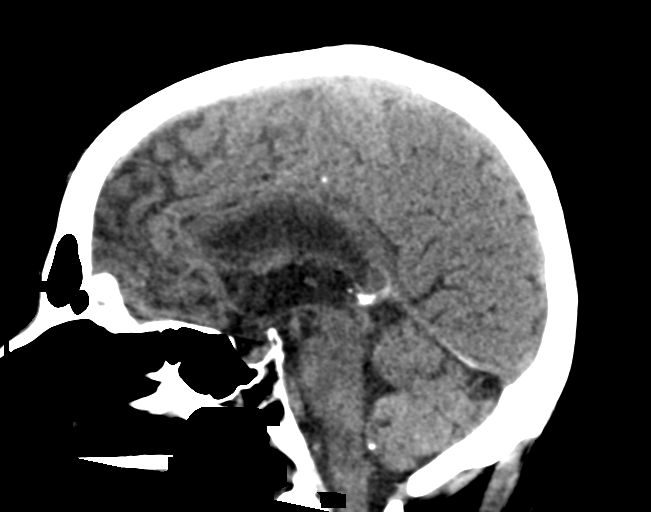
[im 35/52  brain]
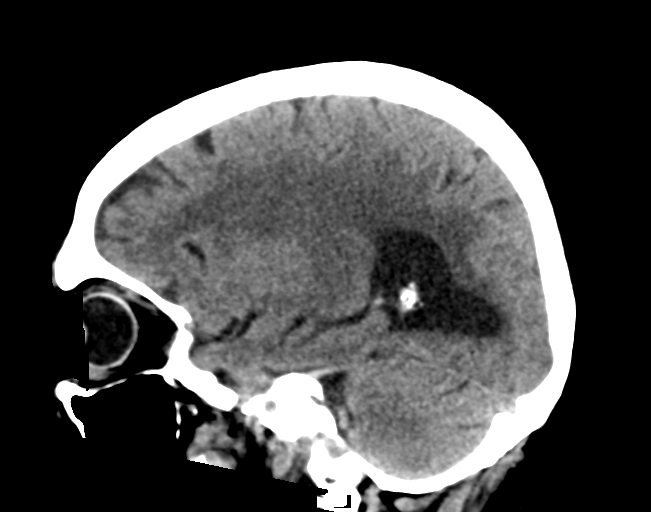

[17 of 47 positions shown; findings below may reference images not displayed]

FINDINGS: Brain: Mild age-related atrophy and moderate chronic microvascular
ischemic changes. There is no acute intracranial hemorrhage. No mass
effect or midline shift. No extra-axial fluid collection.

Vascular: No hyperdense vessel or unexpected calcification.

Skull: Normal. Negative for fracture or focal lesion.

Sinuses/Orbits: No acute finding.

Other: None
IMPRESSION: 1. No acute intracranial hemorrhage.
2. Age-related atrophy and chronic microvascular ischemic changes.

## 2021-12-23 IMAGING — CR DG SHOULDER 2+V*R*
3 series · 3 of 3 positions shown · non-contrast
Comparison: None.

CLINICAL DATA: Fall

EXAM:
RIGHT SHOULDER - 2+ VIEW

[shoulder grashey]
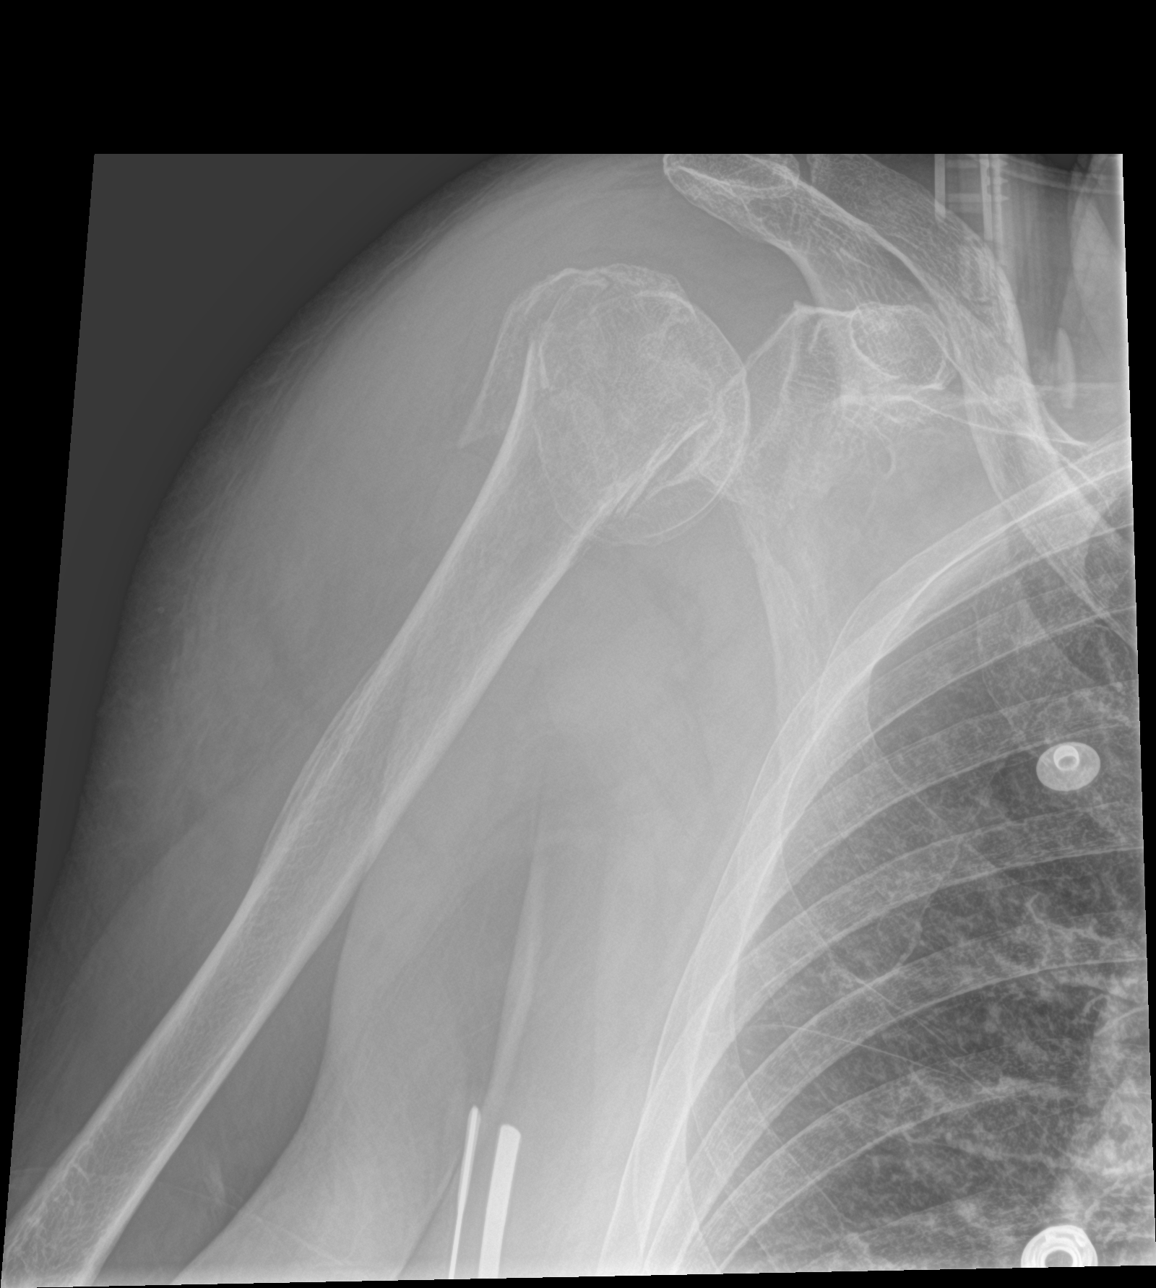

[shoulder y view]
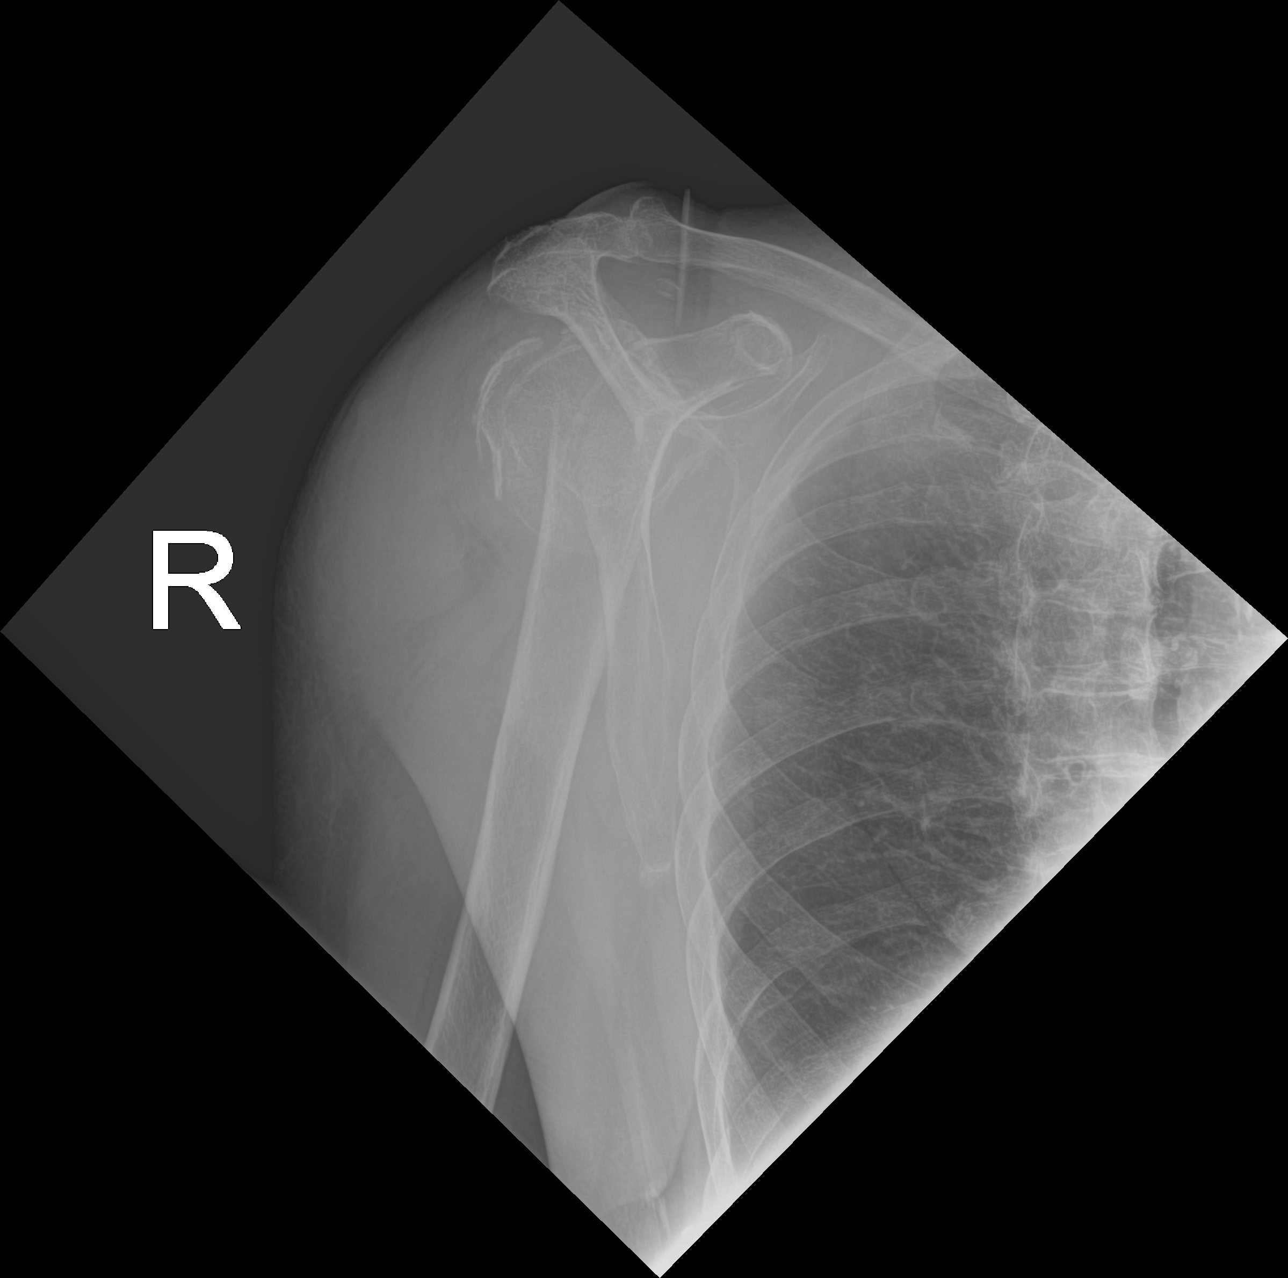

[shoulder ap neutral]
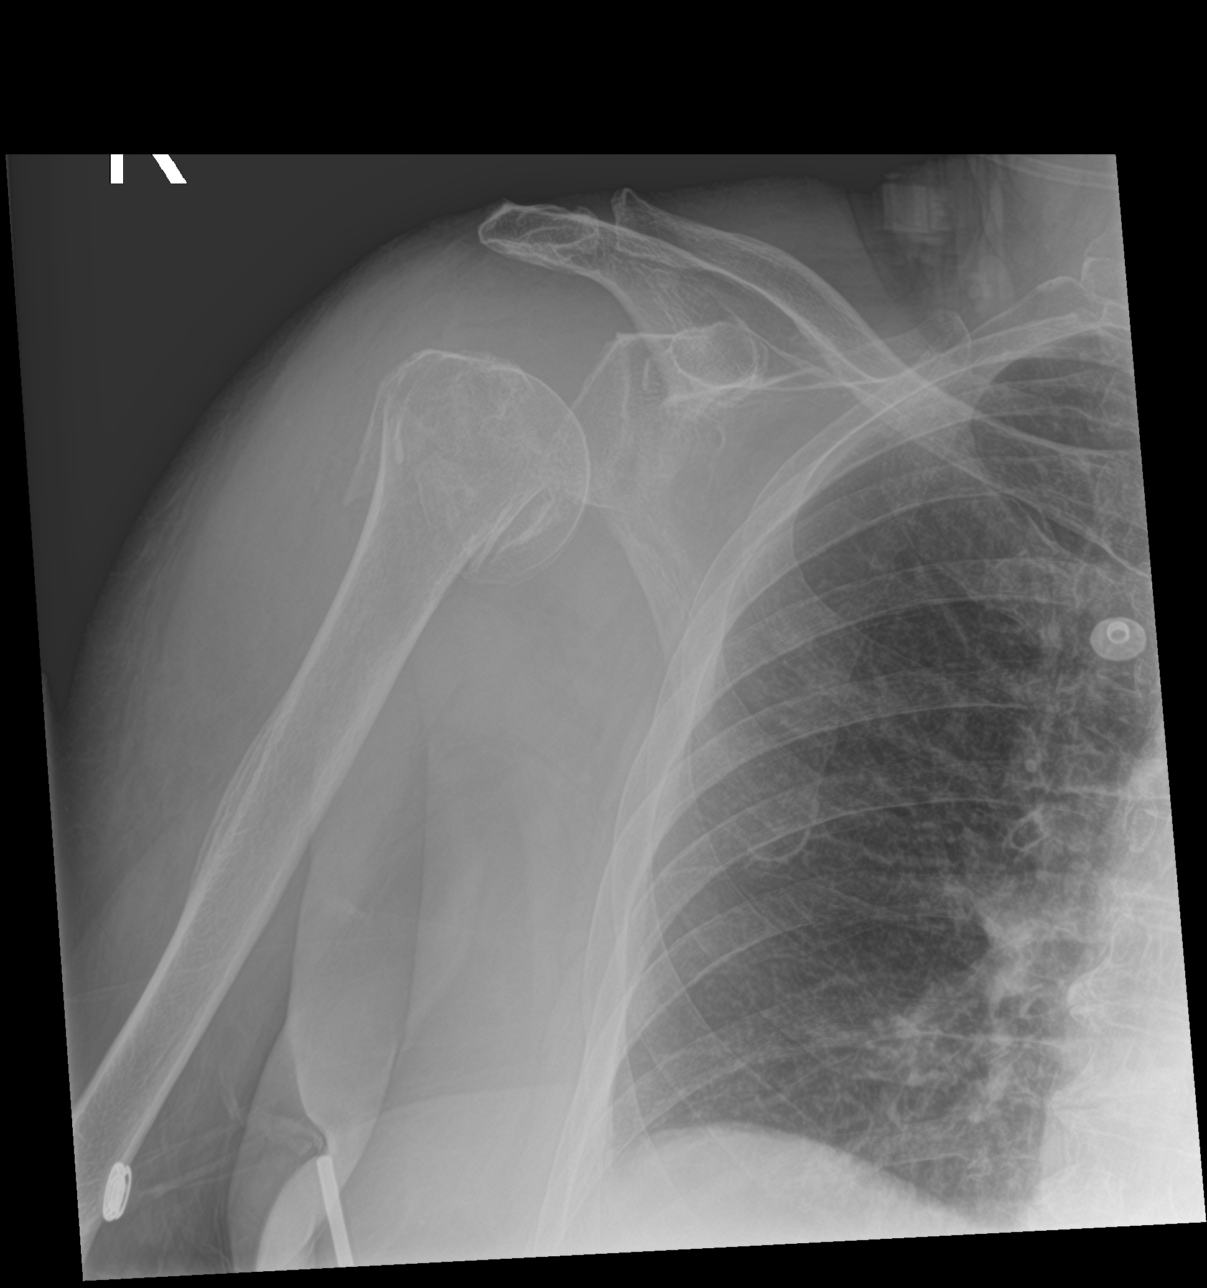

[3 of 3 positions shown; findings below may reference images not displayed]

FINDINGS: There is a comminuted impacted proximal humerus fracture involving
the greater tuberosity and surgical neck. There is slight superior
displacement of the humeral shaft. The humeral head is slightly
inferiorly subluxed on the glenoid surface. There is diffuse
osteopenia. The AC joint appears to be intact. Overlying soft tissue
swelling is seen.
IMPRESSION: Comminuted impacted proximal right humerus fracture.

## 2022-07-29 IMAGING — DX DG CHEST 1V PORT
1 series · 1 of 1 positions shown · non-contrast
Comparison: Portable exam 1766 hours compared to 09/06/2019

Correlation: RIGHT shoulder radiographs 06/07/2020

CLINICAL DATA: Questionable sepsis

EXAM:
PORTABLE CHEST 1 VIEW

[chest ap]
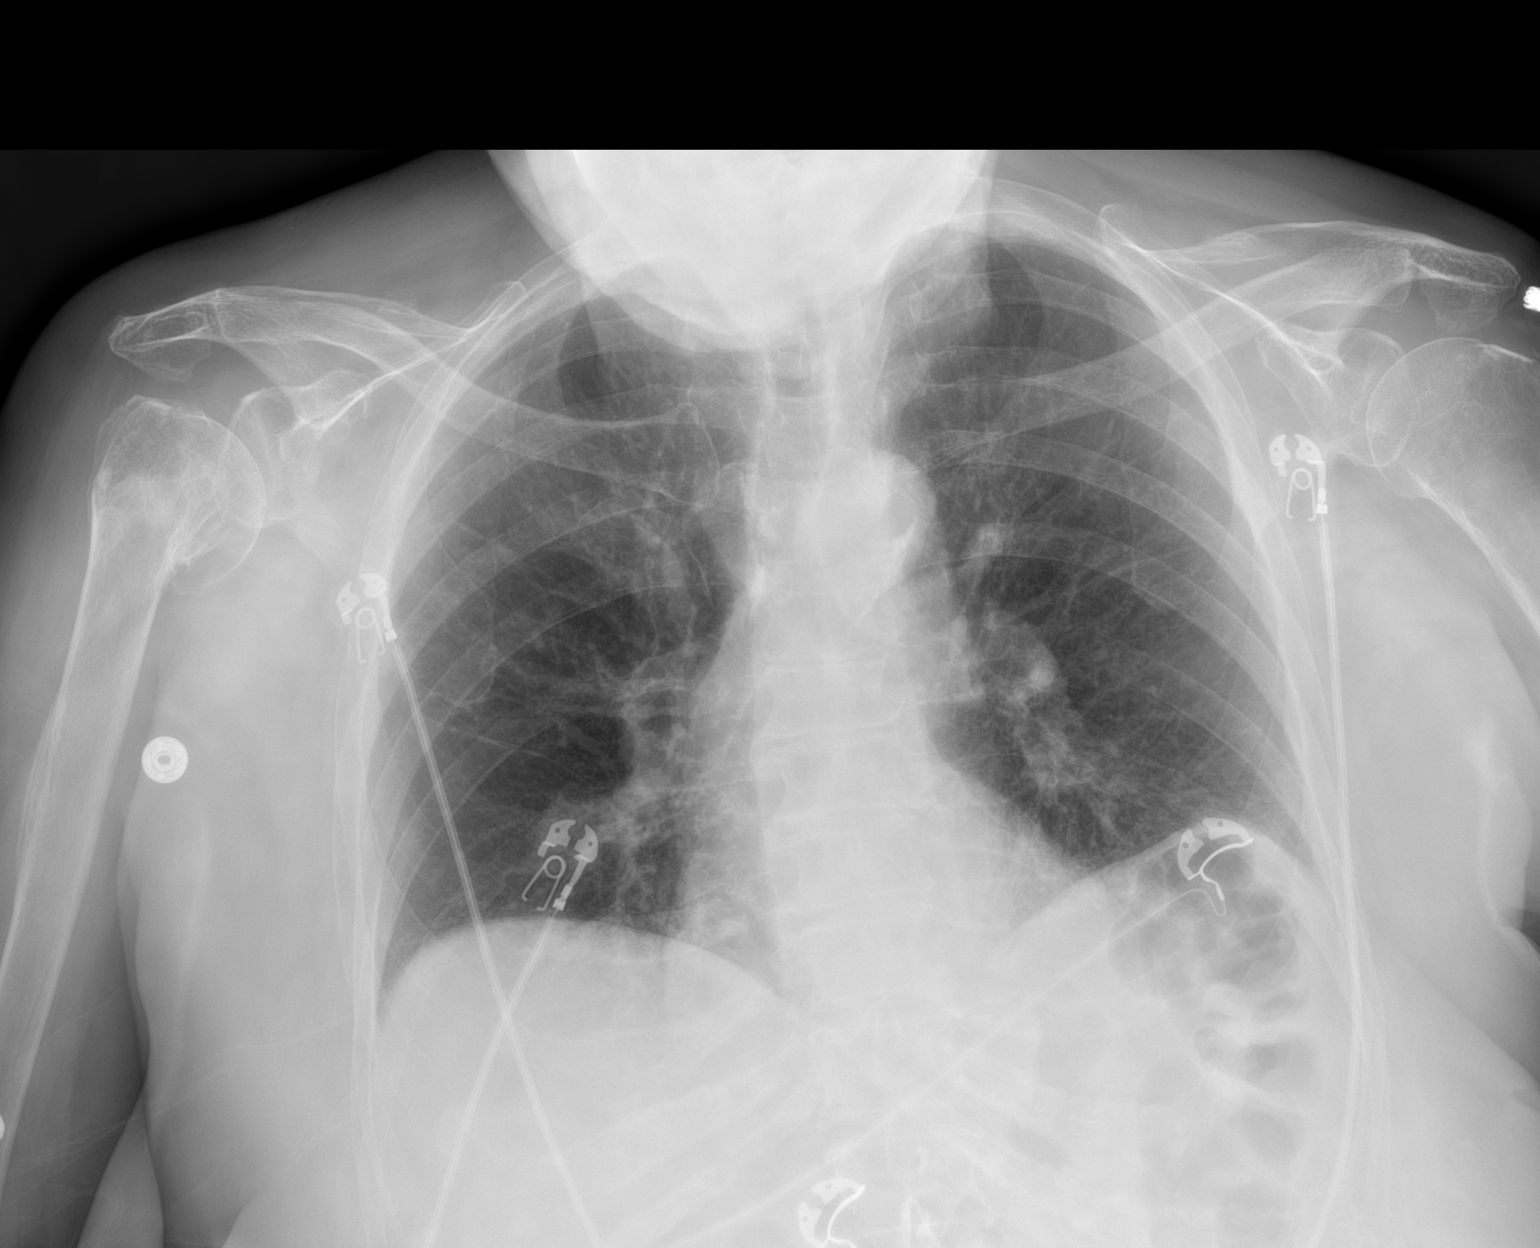

[1 of 1 positions shown; findings below may reference images not displayed]

FINDINGS: Normal heart size, mediastinal contours, and pulmonary vascularity.

Atherosclerotic calcification aorta.

Mildly kyphotic positioning.

No definite infiltrate, pleural effusion, or pneumothorax.

Bones demineralized.

Posttraumatic deformity of the proximal RIGHT humerus unchanged
since 06/07/2020.
IMPRESSION: No acute abnormalities.

## 2023-06-11 ENCOUNTER — Other Ambulatory Visit: Payer: Self-pay | Admitting: Family Medicine

## 2023-06-11 ENCOUNTER — Other Ambulatory Visit: Payer: Medicare PPO

## 2023-06-11 ENCOUNTER — Ambulatory Visit
Admission: RE | Admit: 2023-06-11 | Discharge: 2023-06-11 | Disposition: A | Discharge status: Home / Self Care | Payer: Medicare PPO | Source: Ambulatory Visit | Attending: Family Medicine | Admitting: Family Medicine

## 2023-06-11 DIAGNOSIS — R058 Other specified cough: Secondary | ICD-10-CM

## 2023-06-13 ENCOUNTER — Emergency Department (HOSPITAL_COMMUNITY): Payer: Medicare PPO

## 2023-06-13 ENCOUNTER — Encounter (HOSPITAL_COMMUNITY): Payer: Self-pay

## 2023-06-13 ENCOUNTER — Other Ambulatory Visit: Payer: Self-pay

## 2023-06-13 ENCOUNTER — Inpatient Hospital Stay (HOSPITAL_COMMUNITY)
Admission: EM | Admit: 2023-06-13 | Discharge: 2023-06-20 | DRG: 193 | Disposition: A | Discharge status: Skilled Nursing Facility | Payer: Medicare PPO | Attending: Family Medicine | Admitting: Family Medicine

## 2023-06-13 DIAGNOSIS — R062 Wheezing: Secondary | ICD-10-CM | POA: Diagnosis not present

## 2023-06-13 DIAGNOSIS — E872 Acidosis, unspecified: Secondary | ICD-10-CM | POA: Diagnosis present

## 2023-06-13 DIAGNOSIS — J9601 Acute respiratory failure with hypoxia: Secondary | ICD-10-CM | POA: Diagnosis not present

## 2023-06-13 DIAGNOSIS — J189 Pneumonia, unspecified organism: Secondary | ICD-10-CM | POA: Diagnosis not present

## 2023-06-13 DIAGNOSIS — I10 Essential (primary) hypertension: Secondary | ICD-10-CM | POA: Diagnosis present

## 2023-06-13 DIAGNOSIS — Z720 Tobacco use: Secondary | ICD-10-CM | POA: Diagnosis present

## 2023-06-13 DIAGNOSIS — J441 Chronic obstructive pulmonary disease with (acute) exacerbation: Secondary | ICD-10-CM | POA: Diagnosis present

## 2023-06-13 DIAGNOSIS — I5033 Acute on chronic diastolic (congestive) heart failure: Secondary | ICD-10-CM | POA: Diagnosis not present

## 2023-06-13 DIAGNOSIS — Z79899 Other long term (current) drug therapy: Secondary | ICD-10-CM

## 2023-06-13 DIAGNOSIS — E876 Hypokalemia: Secondary | ICD-10-CM | POA: Diagnosis present

## 2023-06-13 DIAGNOSIS — R6520 Severe sepsis without septic shock: Secondary | ICD-10-CM | POA: Diagnosis not present

## 2023-06-13 DIAGNOSIS — H409 Unspecified glaucoma: Secondary | ICD-10-CM | POA: Diagnosis present

## 2023-06-13 DIAGNOSIS — R911 Solitary pulmonary nodule: Secondary | ICD-10-CM | POA: Diagnosis present

## 2023-06-13 DIAGNOSIS — F17210 Nicotine dependence, cigarettes, uncomplicated: Secondary | ICD-10-CM | POA: Diagnosis present

## 2023-06-13 DIAGNOSIS — A419 Sepsis, unspecified organism: Secondary | ICD-10-CM | POA: Diagnosis not present

## 2023-06-13 DIAGNOSIS — J9691 Respiratory failure, unspecified with hypoxia: Secondary | ICD-10-CM | POA: Diagnosis present

## 2023-06-13 DIAGNOSIS — I110 Hypertensive heart disease with heart failure: Secondary | ICD-10-CM | POA: Diagnosis present

## 2023-06-13 DIAGNOSIS — Z1152 Encounter for screening for COVID-19: Secondary | ICD-10-CM

## 2023-06-13 DIAGNOSIS — J440 Chronic obstructive pulmonary disease with acute lower respiratory infection: Secondary | ICD-10-CM | POA: Diagnosis present

## 2023-06-13 HISTORY — DX: Heart failure, unspecified: I50.9

## 2023-06-13 LAB — COMPREHENSIVE METABOLIC PANEL
ALT: 15 U/L (ref 0–44)
AST: 28 U/L (ref 15–41)
Albumin: 2.9 g/dL — ABNORMAL LOW (ref 3.5–5.0)
Alkaline Phosphatase: 80 U/L (ref 38–126)
Anion gap: 12 (ref 5–15)
BUN: 12 mg/dL (ref 8–23)
CO2: 26 mmol/L (ref 22–32)
Calcium: 9.4 mg/dL (ref 8.9–10.3)
Chloride: 99 mmol/L (ref 98–111)
Creatinine, Ser: 0.72 mg/dL (ref 0.44–1.00)
GFR, Estimated: >60 mL/min (ref >60)
Glucose, Bld: 141 mg/dL — ABNORMAL HIGH (ref 70–99)
Potassium: 3.1 mmol/L — ABNORMAL LOW (ref 3.5–5.1)
Sodium: 137 mmol/L (ref 135–145)
Total Bilirubin: 1.1 mg/dL (ref 0.3–1.2)
Total Protein: 7.1 g/dL (ref 6.5–8.1)

## 2023-06-13 LAB — CBC
HCT: 45 % (ref 36.0–46.0)
Hemoglobin: 15 g/dL (ref 12.0–15.0)
MCH: 29.4 pg (ref 26.0–34.0)
MCHC: 33.3 g/dL (ref 30.0–36.0)
MCV: 88.2 fL (ref 80.0–100.0)
Platelets: 407 10*3/uL — ABNORMAL HIGH (ref 150–400)
RBC: 5.1 MIL/uL (ref 3.87–5.11)
RDW: 13.1 % (ref 11.5–15.5)
WBC: 22 10*3/uL — ABNORMAL HIGH (ref 4.0–10.5)
nRBC: 0 % (ref 0.0–0.2)

## 2023-06-13 LAB — SARS CORONAVIRUS 2 BY RT PCR: SARS Coronavirus 2 by RT PCR: NEGATIVE

## 2023-06-13 LAB — LACTIC ACID, PLASMA: Lactic Acid, Venous: 2.4 mmol/L (ref 0.5–1.9)

## 2023-06-13 LAB — BRAIN NATRIURETIC PEPTIDE: B Natriuretic Peptide: 265.4 pg/mL — ABNORMAL HIGH (ref 0.0–100.0)

## 2023-06-13 LAB — MAGNESIUM: Magnesium: 1.9 mg/dL (ref 1.7–2.4)

## 2023-06-13 MED ORDER — IPRATROPIUM BROMIDE 0.02 % IN SOLN
0.5000 mg | Freq: Once | RESPIRATORY_TRACT | Status: AC
  Administered 2023-06-13: 0.5 mg via RESPIRATORY_TRACT
  Filled 2023-06-13: qty 2.5

## 2023-06-13 MED ORDER — POLYETHYLENE GLYCOL 3350 17 G PO PACK: 17.0000 g | PACK | Freq: Every day | ORAL | Status: DC | PRN

## 2023-06-13 MED ORDER — SODIUM CHLORIDE 0.9% FLUSH
3.0000 mL | Freq: Two times a day (BID) | INTRAVENOUS | Status: DC
  Administered 2023-06-14 – 2023-06-20 (×12): 3 mL via INTRAVENOUS

## 2023-06-13 MED ORDER — ENOXAPARIN SODIUM 40 MG/0.4ML IJ SOSY
40.0000 mg | PREFILLED_SYRINGE | Freq: Every day | INTRAMUSCULAR | Status: DC
  Administered 2023-06-13 – 2023-06-19 (×7): 40 mg via SUBCUTANEOUS
  Filled 2023-06-13 (×7): qty 0.4

## 2023-06-13 MED ORDER — IPRATROPIUM BROMIDE 0.02 % IN SOLN
0.5000 mg | Freq: Four times a day (QID) | RESPIRATORY_TRACT | Status: DC
  Administered 2023-06-13: 0.5 mg via RESPIRATORY_TRACT
  Filled 2023-06-13 (×2): qty 2.5

## 2023-06-13 MED ORDER — ACETAMINOPHEN 325 MG PO TABS: 650.0000 mg | ORAL_TABLET | Freq: Four times a day (QID) | ORAL | Status: DC | PRN

## 2023-06-13 MED ORDER — SODIUM CHLORIDE 0.9 % IV SOLN
500.0000 mg | INTRAVENOUS | Status: DC
  Administered 2023-06-14: 500 mg via INTRAVENOUS
  Filled 2023-06-13 (×2): qty 5

## 2023-06-13 MED ORDER — LATANOPROST 0.005 % OP SOLN
1.0000 [drp] | Freq: Every day | OPHTHALMIC | Status: DC
  Administered 2023-06-13 – 2023-06-19 (×6): 1 [drp] via OPHTHALMIC
  Filled 2023-06-13: qty 2.5

## 2023-06-13 MED ORDER — ALBUTEROL SULFATE (2.5 MG/3ML) 0.083% IN NEBU
5.0000 mg | INHALATION_SOLUTION | Freq: Once | RESPIRATORY_TRACT | Status: AC
  Administered 2023-06-13: 5 mg via RESPIRATORY_TRACT
  Filled 2023-06-13: qty 6

## 2023-06-13 MED ORDER — BRIMONIDINE TARTRATE 0.15 % OP SOLN
1.0000 [drp] | Freq: Two times a day (BID) | OPHTHALMIC | Status: DC
  Administered 2023-06-13 – 2023-06-20 (×13): 1 [drp] via OPHTHALMIC
  Filled 2023-06-13: qty 5

## 2023-06-13 MED ORDER — SODIUM CHLORIDE 0.9 % IV SOLN
1.0000 g | Freq: Once | INTRAVENOUS | Status: AC
  Administered 2023-06-13: 1 g via INTRAVENOUS
  Filled 2023-06-13: qty 10

## 2023-06-13 MED ORDER — ALBUTEROL SULFATE (2.5 MG/3ML) 0.083% IN NEBU: 2.5000 mg | INHALATION_SOLUTION | RESPIRATORY_TRACT | Status: DC | PRN

## 2023-06-13 MED ORDER — ACETAMINOPHEN 650 MG RE SUPP: 650.0000 mg | Freq: Four times a day (QID) | RECTAL | Status: DC | PRN

## 2023-06-13 MED ORDER — POTASSIUM CHLORIDE CRYS ER 20 MEQ PO TBCR
40.0000 meq | EXTENDED_RELEASE_TABLET | Freq: Once | ORAL | Status: AC
  Administered 2023-06-13: 40 meq via ORAL
  Filled 2023-06-13: qty 2

## 2023-06-13 MED ORDER — METHYLPREDNISOLONE SODIUM SUCC 125 MG IJ SOLR
125.0000 mg | Freq: Once | INTRAMUSCULAR | Status: AC
  Administered 2023-06-13: 125 mg via INTRAVENOUS
  Filled 2023-06-13: qty 2

## 2023-06-13 MED ORDER — LOSARTAN POTASSIUM 50 MG PO TABS
25.0000 mg | ORAL_TABLET | Freq: Every day | ORAL | Status: DC
Start: 2023-06-14 — End: 2023-06-20
  Administered 2023-06-14 – 2023-06-20 (×7): 25 mg via ORAL
  Filled 2023-06-13 (×7): qty 1

## 2023-06-13 MED ORDER — SODIUM CHLORIDE 0.9 % IV SOLN
500.0000 mg | Freq: Once | INTRAVENOUS | Status: AC
  Administered 2023-06-13: 500 mg via INTRAVENOUS
  Filled 2023-06-13: qty 5

## 2023-06-13 MED ORDER — IOHEXOL 350 MG/ML SOLN
75.0000 mL | Freq: Once | INTRAVENOUS | Status: AC | PRN
  Administered 2023-06-13: 75 mL via INTRAVENOUS

## 2023-06-13 MED ORDER — SODIUM CHLORIDE 0.9 % IV SOLN
2.0000 g | INTRAVENOUS | Status: AC
  Administered 2023-06-14 – 2023-06-18 (×5): 2 g via INTRAVENOUS
  Filled 2023-06-13 (×5): qty 20

## 2023-06-13 NOTE — ED Provider Notes (Signed)
[place] EMERGENCY DEPARTMENT AT [facility] Provider Note   CSN: [id number] Arrival date & time: 06/13/23  1036     History  Chief Complaint  Patient presents with   Cough    Annette Berry is a 82 y.o. female.  Pt with hx copd, c/o productive cough and sob in past week. No sore throat or runny nose. No fever or chills. Denies chest pain or discomfort. Uses breathing txs prn. Former smoker. No leg pain or swelling. No specific known ill contacts. Pt relatively poor historian, level 5 caveat.  The history is provided by the patient and the EMS personnel. The history is limited by the condition of the patient.  Cough Associated symptoms: shortness of breath and wheezing   Associated symptoms: no chest pain, no chills, no fever, no headaches, no rash and no sore throat        Home Medications Prior to Admission medications   Medication Sig Start Date End Date Taking? Authorizing Provider  acetaminophen (TYLENOL) 650 MG CR tablet Take 650-1,300 mg by mouth in the morning and at bedtime.   Yes [provider]  brimonidine (ALPHAGAN) 0.15 % ophthalmic solution Place 1 drop into both eyes 2 (two) times daily.  08/20/19  Yes [provider]  cholecalciferol (VITAMIN D) 25 MCG tablet Take 2 tablets (2,000 Units total) by mouth 2 (two) times daily. Patient taking differently: Take 2,000 Units by mouth daily. 09/09/19  Yes Swayze, Ava, DO  latanoprost (XALATAN) 0.005 % ophthalmic solution Place 1 drop into both eyes at bedtime.  08/20/19  Yes [provider]  losartan (COZAAR) 25 MG tablet Take 1 tablet (25 mg total) by mouth daily. 01/14/21  Yes Christian, Rylee, MD  polyethylene glycol powder (MIRALAX) 17 GM/SCOOP powder Take 255 g by mouth daily. 09/09/19  Yes Swayze, Ava, DO  diclofenac Sodium (VOLTAREN) 1 % GEL Apply 4 g topically 4 (four) times daily. Patient not taking: Reported on 06/13/2023 06/07/20   Melene Plan, DO  feeding supplement,  ENSURE ENLIVE, (ENSURE ENLIVE) LIQD Take 237 mLs by mouth 2 (two) times daily between meals. Patient not taking: Reported on 06/07/2020 09/10/19   Swayze, Ava, DO  potassium chloride SA (KLOR-CON) 20 MEQ tablet Take 1 tablet (20 mEq total) by mouth 2 (two) times daily. Patient not taking: Reported on 06/13/2023 01/14/21   Elige Radon, MD  traMADol (ULTRAM) 50 MG tablet Take 1 tablet (50 mg total) by mouth every 6 (six) hours as needed for moderate pain. Patient not taking: Reported on 06/13/2023 01/14/21   Elige Radon, MD      Allergies    Patient has no known allergies.    Review of Systems   Review of Systems  Constitutional:  Negative for chills and fever.  HENT:  Negative for sore throat.   Eyes:  Negative for redness.  Respiratory:  Positive for cough, shortness of breath and wheezing.   Cardiovascular:  Negative for chest pain, palpitations and leg swelling.  Gastrointestinal:  Negative for abdominal pain, diarrhea and vomiting.  Genitourinary:  Negative for dysuria and flank pain.  Musculoskeletal:  Negative for back pain and neck pain.  Skin:  Negative for rash.  Neurological:  Negative for headaches.  Hematological:  Does not bruise/bleed easily.    Physical Exam Updated Vital Signs BP (!) 141/54   Pulse (!) 106   Temp 98.6 F (37 C) (Oral)   Resp (!) 24   Ht 1.626 m (5\' 4" )   Wt  63 kg   SpO2 94%   BMI 23.84 kg/m  Physical Exam Vitals and nursing note reviewed.  Constitutional:      Appearance: Normal appearance. She is well-developed.  HENT:     Head: Atraumatic.     Nose: Nose normal.     Mouth/Throat:     Mouth: Mucous membranes are moist.  Eyes:     General: No scleral icterus.    Conjunctiva/sclera: Conjunctivae normal.     Pupils: Pupils are equal, round, and reactive to light.  Neck:     Vascular: No carotid bruit.     Trachea: No tracheal deviation.     Comments: No stiffness or rigidity Cardiovascular:     Rate and Rhythm: Normal rate and  regular rhythm.     Pulses: Normal pulses.     Heart sounds: Normal heart sounds. No murmur heard.    No friction rub. No gallop.  Pulmonary:     Effort: Pulmonary effort is normal. No respiratory distress.     Breath sounds: Wheezing present.  Abdominal:     General: Bowel sounds are normal. There is no distension.     Palpations: Abdomen is soft.     Tenderness: There is no abdominal tenderness.  Genitourinary:    Comments: No cva tenderness.  Musculoskeletal:        General: No swelling or tenderness.     Cervical back: Normal range of motion and neck supple. No rigidity or tenderness. No muscular tenderness.     Right lower leg: No edema.     Left lower leg: No edema.  Skin:    General: Skin is warm and dry.     Findings: No rash.  Neurological:     Mental Status: She is alert.     Comments: Alert, speech normal. Motor/sens grossly intact bil.   Psychiatric:        Mood and Affect: Mood normal.     ED Results / Procedures / Treatments   Labs (all labs ordered are listed, but only abnormal results are displayed) Results for orders placed or performed during the hospital encounter of 06/13/23  SARS Coronavirus 2 by RT PCR (hospital order, performed in [facility] hospital lab) *cepheid single result test* Anterior Nasal Swab   Specimen: Anterior Nasal Swab  Result Value Ref Range   SARS Coronavirus 2 by RT PCR NEGATIVE NEGATIVE  CBC  Result Value Ref Range   WBC 22.0 (H) 4.0 - 10.5 K/uL   RBC 5.10 3.87 - 5.11 MIL/uL   Hemoglobin 15.0 12.0 - 15.0 g/dL   HCT 13.2 44.0 - 10.2 %   MCV 88.2 80.0 - 100.0 fL   MCH 29.4 26.0 - 34.0 pg   MCHC 33.3 30.0 - 36.0 g/dL   RDW 72.5 36.6 - 44.0 %   Platelets 407 (H) 150 - 400 K/uL   nRBC 0.0 0.0 - 0.2 %  Comprehensive metabolic panel  Result Value Ref Range   Sodium 137 135 - 145 mmol/L   Potassium 3.1 (L) 3.5 - 5.1 mmol/L   Chloride 99 98 - 111 mmol/L   CO2 26 22 - 32 mmol/L   Glucose, Bld 141 (H) 70 - 99 mg/dL   BUN 12 8 -  23 mg/dL   Creatinine, Ser 3.47 0.44 - 1.00 mg/dL   Calcium 9.4 8.9 - 42.5 mg/dL   Total Protein 7.1 6.5 - 8.1 g/dL   Albumin 2.9 (L) 3.5 - 5.0 g/dL   AST 28 15 -  41 U/L   ALT 15 0 - 44 U/L   Alkaline Phosphatase 80 38 - 126 U/L   Total Bilirubin 1.1 0.3 - 1.2 mg/dL   GFR, Estimated >16 >10 mL/min   Anion gap 12 5 - 15  Brain natriuretic peptide  Result Value Ref Range   B Natriuretic Peptide 265.4 (H) 0.0 - 100.0 pg/mL     EKG EKG Interpretation Date/Time:  Thursday June 13 2023 10:43:02 EDT Ventricular Rate:  108 PR Interval:  135 QRS Duration:  86 QT Interval:  318 QTC Calculation: 427 R Axis:   52  Text Interpretation: Sinus tachycardia Atrial premature complexes Confirmed by Cathren Laine ([id number]) on 06/13/2023 10:56:26 AM  Radiology CT Angio Chest PE W/Cm &/Or Wo Cm  Result Date: 06/13/2023 CLINICAL DATA:  Pulmonary embolism (PE) suspected, low to intermediate prob, positive D-dimer Pulmonary embolism (PE) suspected, high prob. EXAM: CT ANGIOGRAPHY CHEST WITH CONTRAST TECHNIQUE: Multidetector CT imaging of the chest was performed using the standard protocol during bolus administration of intravenous contrast. Multiplanar CT image reconstructions and MIPs were obtained to evaluate the vascular anatomy. RADIATION DOSE REDUCTION: This exam was performed according to the departmental dose-optimization program which includes automated exposure control, adjustment of the mA and/or kV according to patient size and/or use of iterative reconstruction technique. CONTRAST:  75mL OMNIPAQUE IOHEXOL 350 MG/ML SOLN COMPARISON:  Chest radiographs 06/13/2023. FINDINGS: Cardiovascular: Satisfactory opacification of the pulmonary arteries to the segmental level. No evidence of pulmonary embolism. Extensive coronary artery calcifications. Atherosclerotic calcifications of the thoracic aorta. Mediastinum/Nodes: No enlarged mediastinal, hilar, or axillary lymph nodes. Thyroid gland, trachea, and  esophagus demonstrate no significant findings. Lungs/Pleura: Moderate centrilobular emphysema. 13 x 7 mm solid nodule in the right upper lobe (axial image 36 series 7). Consolidative opacities in dependent portions of the left-greater-than-right lower lobes, suspicious for aspiration or infection. No pleural effusion or pneumothorax. Upper Abdomen: No acute abnormality. Musculoskeletal: No chest wall abnormality. No acute or significant osseous findings. Review of the MIP images confirms the above findings. IMPRESSION: 1. No evidence of pulmonary embolism. 2. Consolidative opacities in dependent portions of the left-greater-than-right lower lobes, suspicious for aspiration or infection. 3. 13 x 7 mm solid nodule in the right upper lobe. Per Fleischner Society Guidelines, consider a non-contrast Chest CT at 3 months, a PET/CT, or tissue sampling. These guidelines do not apply to immunocompromised patients and patients with cancer. Follow up in patients with significant comorbidities as clinically warranted. Reference: Radiology. 2017; 284(1):228-43. Aortic Atherosclerosis (ICD10-I70.0) and Emphysema (ICD10-J43.9). Electronically Signed   By: Orvan Falconer M.D.   On: 06/13/2023 14:10   DG Chest Portable 1 View  Result Date: 06/13/2023 CLINICAL DATA:  Cough. EXAM: PORTABLE CHEST 1 VIEW COMPARISON:  06/11/2023 FINDINGS: Normal heart size and mediastinal contours. No signs of pleural effusion or edema. Asymmetric airspace disease is identified within the left lung base which appears similar to the previous exam. Right lung appears clear. Remote fracture deformity of the proximal right humerus. IMPRESSION: Persistent asymmetric airspace disease within the left base compatible with pneumonia. Electronically Signed   By: Signa Kell M.D.   On: 06/13/2023 11:25    Procedures Procedures    Medications Ordered in ED Medications  cefTRIAXone (ROCEPHIN) 1 g in sodium chloride 0.9 % 100 mL IVPB (has no  administration in time range)  azithromycin (ZITHROMAX) 500 mg in sodium chloride 0.9 % 250 mL IVPB (has no administration in time range)  albuterol (PROVENTIL) (2.5 MG/3ML) 0.083% nebulizer solution 5 mg (  has no administration in time range)  ipratropium (ATROVENT) nebulizer solution 0.5 mg (has no administration in time range)  potassium chloride SA (KLOR-CON M) CR tablet 40 mEq (has no administration in time range)  methylPREDNISolone sodium succinate (SOLU-MEDROL) 125 mg/2 mL injection 125 mg (125 mg Intravenous Given 06/13/23 1119)  albuterol (PROVENTIL) (2.5 MG/3ML) 0.083% nebulizer solution 5 mg (5 mg Nebulization Given 06/13/23 1119)  ipratropium (ATROVENT) nebulizer solution 0.5 mg (0.5 mg Nebulization Given 06/13/23 1119)  iohexol (OMNIPAQUE) 350 MG/ML injection 75 mL (75 mLs Intravenous Contrast Given 06/13/23 1358)    ED Course/ Medical Decision Making/ A&P                             Medical Decision Making Problems Addressed: Acute respiratory failure with hypoxia [facility]): acute illness or injury with systemic symptoms that poses a threat to life or bodily functions Community acquired pneumonia, bilateral: acute illness or injury with systemic symptoms that poses a threat to life or bodily functions Hypokalemia: acute illness or injury Wheezing: acute illness or injury with systemic symptoms  Amount and/or Complexity of Data Reviewed Independent Historian: EMS    Details: hx External Data Reviewed: notes. Labs: ordered. Decision-making details documented in ED Course. Radiology: ordered and independent interpretation performed. Decision-making details documented in ED Course. ECG/medicine tests: ordered and independent interpretation performed. Decision-making details documented in ED Course. Discussion of management or test interpretation with external provider(s): Medicine,   Risk Prescription drug management. Decision regarding hospitalization.   Iv ns. Continuous pulse  ox and cardiac monitoring. Labs ordered/sent. Imaging ordered.   Differential diagnosis includes copd exacerbation, pna, etc. Dispo decision including potential need for admission considered - will get labs and imaging and reassess.   Reviewed nursing notes and prior charts for additional history. External reports reviewed. Additional history from: EMS.   Cardiac monitor: sinus rhythm, rate 105.  Solumedrol iv. Albuterol neb. Atrovent neb.  Room air pulse ox 85%. On 2 liters, sats 95%.   Labs reviewed/interpreted by me - wbc elev. K low. Kcl po.   Xrays reviewed/interpreted by me - ?infiltrate.   CT reviewed/interpreted by me - bil ll infiltrate L>R, c/w pna, no PE.  Rocephin and zithromax iv.  Wheezing persistent but improved. Albuterol and atrovent neb tx.   Medicine consulted for admission.  CRITICAL CARE RE: bilateral pneumonia, acute respiratory failure w hypoxia.  Performed by: Suzi Roots Total critical care time: 45 minutes Critical care time was exclusive of separately billable procedures and treating other patients. Critical care was necessary to treat or prevent imminent or life-threatening deterioration. Critical care was time spent personally by me on the following activities: development of treatment plan with patient and/or surrogate as well as nursing, discussions with consultants, evaluation of patient's response to treatment, examination of patient, obtaining history from patient or surrogate, ordering and performing treatments and interventions, ordering and review of laboratory studies, ordering and review of radiographic studies, pulse oximetry and re-evaluation of patient's condition.          Final Clinical Impression(s) / ED Diagnoses Final diagnoses:  Community acquired pneumonia, bilateral  Acute respiratory failure with hypoxia (HCC)  Wheezing  Hypokalemia    Rx / DC Orders ED Discharge Orders     None         Cathren Laine,  MD 06/13/23 1440

## 2023-06-13 NOTE — ED Triage Notes (Signed)
Pt present to ED from home with c/o nonproductive cough times two weeks, currently being treated by PCP. Family states pt has been noted with increased confusion, poor appetite for last five days. Pt denies shortness of breath, chest pain at this time. Per EMS, pt noted 82% room air, oxygen currently 90% via 2L [place]. Pt refused EKG, IV via EMS. Pt alert to name and place at this time.

## 2023-06-13 NOTE — ED Notes (Signed)
ED TO INPATIENT HANDOFF REPORT  ED Nurse Name and Phone #:  Theophilus Bones [phone]  S Name/Age/Gender Annette Berry 82 y.o. female Room/Bed: 028C/028C  Code Status   Code Status: Full Code  Home/SNF/Other Home Patient oriented to: self and place Is this baseline? Yes   Triage Complete: Triage complete  Chief Complaint Acute respiratory failure with hypoxia (HCC) [J96.01]  Triage Note Pt present to ED from home with c/o nonproductive cough times two weeks, currently being treated by PCP. Family states pt has been noted with increased confusion, poor appetite for last five days. Pt denies shortness of breath, chest pain at this time. Per EMS, pt noted 82% room air, oxygen currently 90% via 2L [place]. Pt refused EKG, IV via EMS. Pt alert to name and place at this time.   Allergies No Known Allergies  Level of Care/Admitting Diagnosis ED Disposition     ED Disposition  Admit   Condition  --   Comment  Hospital Area: [facility] [[id number]]  Level of Care: Telemetry Medical [104]  May place patient in observation at [facility] or [facility] if equivalent level of care is available:: No  Covid Evaluation: Confirmed COVID Negative  Diagnosis: Acute respiratory failure with hypoxia [facility]) [[id number]]  Admitting Physician: Synetta Fail [[id number]]  Attending Physician: Synetta Fail [[id number]]          B Medical/Surgery History Past Medical History:  Diagnosis Date   Arthritis    CHF (congestive heart failure) (HCC)    HOH (hard of hearing)    Past Surgical History:  Procedure Laterality Date   INTRAMEDULLARY (IM) NAIL INTERTROCHANTERIC Left 09/07/2019   INTRAMEDULLARY (IM) NAIL INTERTROCHANTRIC (Left Hip)   INTRAMEDULLARY (IM) NAIL INTERTROCHANTERIC Left 09/07/2019   Procedure: INTRAMEDULLARY (IM) NAIL INTERTROCHANTRIC;  Surgeon: Roby Lofts, MD;  Location: [facility] OR;  Service: Orthopedics;  Laterality: Left;     A IV  Location/Drains/Wounds Patient Lines/Drains/Airways Status     Active Line/Drains/Airways     Name Placement date Placement time Site Days   Peripheral IV 06/13/23 20 G 1" Anterior;Left;Proximal Forearm 06/13/23  1118  Forearm  less than 1   Peripheral IV 06/13/23 22 G 1" Posterior;Right Hand 06/13/23  1502  Hand  less than 1            Intake/Output Last 24 hours No intake or output data in the 24 hours ending 06/13/23 1525  Labs/Imaging Results for orders placed or performed during the hospital encounter of 06/13/23 (from the past 48 hour(s))  SARS Coronavirus 2 by RT PCR (hospital order, performed in [facility] hospital lab) *cepheid single result test* Anterior Nasal Swab     Status: None   Collection Time: 06/13/23 10:53 AM   Specimen: Anterior Nasal Swab  Result Value Ref Range   SARS Coronavirus 2 by RT PCR NEGATIVE NEGATIVE    Comment: Performed at [facility], [address], [place], [place] [address]  CBC     Status: Abnormal   Collection Time: 06/13/23 10:53 AM  Result Value Ref Range   WBC 22.0 (H) 4.0 - 10.5 K/uL   RBC 5.10 3.87 - 5.11 MIL/uL   Hemoglobin 15.0 12.0 - 15.0 g/dL   HCT 32.9 51.8 - 84.1 %   MCV 88.2 80.0 - 100.0 fL   MCH 29.4 26.0 - 34.0 pg   MCHC 33.3 30.0 - 36.0 g/dL   RDW 66.0 63.0 - 16.0 %   Platelets 407 (H) 150 -  400 K/uL   nRBC 0.0 0.0 - 0.2 %    Comment: Performed at [facility], [address], [place], [place] [address]  Comprehensive metabolic panel     Status: Abnormal   Collection Time: 06/13/23 10:53 AM  Result Value Ref Range   Sodium 137 135 - 145 mmol/L   Potassium 3.1 (L) 3.5 - 5.1 mmol/L   Chloride 99 98 - 111 mmol/L   CO2 26 22 - 32 mmol/L   Glucose, Bld 141 (H) 70 - 99 mg/dL    Comment: Glucose reference range applies only to samples taken after fasting for at least 8 hours.   BUN 12 8 - 23 mg/dL   Creatinine, Ser 6.04 0.44 - 1.00 mg/dL   Calcium 9.4 8.9 - 54.0 mg/dL   Total Protein 7.1 6.5 - 8.1 g/dL    Albumin 2.9 (L) 3.5 - 5.0 g/dL   AST 28 15 - 41 U/L   ALT 15 0 - 44 U/L   Alkaline Phosphatase 80 38 - 126 U/L   Total Bilirubin 1.1 0.3 - 1.2 mg/dL   GFR, Estimated >98 >11 mL/min    Comment: (NOTE) Calculated using the CKD-EPI Creatinine Equation (2021)    Anion gap 12 5 - 15    Comment: Performed at [facility], [address] [place], [place] [address]  Brain natriuretic peptide     Status: Abnormal   Collection Time: 06/13/23 10:53 AM  Result Value Ref Range   B Natriuretic Peptide 265.4 (H) 0.0 - 100.0 pg/mL    Comment: Performed at [facility], [address] [place], [place] [address]   CT Angio Chest PE W/Cm &/Or Wo Cm  Result Date: 06/13/2023 CLINICAL DATA:  Pulmonary embolism (PE) suspected, low to intermediate prob, positive D-dimer Pulmonary embolism (PE) suspected, high prob. EXAM: CT ANGIOGRAPHY CHEST WITH CONTRAST TECHNIQUE: Multidetector CT imaging of the chest was performed using the standard protocol during bolus administration of intravenous contrast. Multiplanar CT image reconstructions and MIPs were obtained to evaluate the vascular anatomy. RADIATION DOSE REDUCTION: This exam was performed according to the departmental dose-optimization program which includes automated exposure control, adjustment of the mA and/or kV according to patient size and/or use of iterative reconstruction technique. CONTRAST:  75mL OMNIPAQUE IOHEXOL 350 MG/ML SOLN COMPARISON:  Chest radiographs 06/13/2023. FINDINGS: Cardiovascular: Satisfactory opacification of the pulmonary arteries to the segmental level. No evidence of pulmonary embolism. Extensive coronary artery calcifications. Atherosclerotic calcifications of the thoracic aorta. Mediastinum/Nodes: No enlarged mediastinal, hilar, or axillary lymph nodes. Thyroid gland, trachea, and esophagus demonstrate no significant findings. Lungs/Pleura: Moderate centrilobular emphysema. 13 x 7 mm solid nodule in the right upper lobe  (axial image 36 series 7). Consolidative opacities in dependent portions of the left-greater-than-right lower lobes, suspicious for aspiration or infection. No pleural effusion or pneumothorax. Upper Abdomen: No acute abnormality. Musculoskeletal: No chest wall abnormality. No acute or significant osseous findings. Review of the MIP images confirms the above findings. IMPRESSION: 1. No evidence of pulmonary embolism. 2. Consolidative opacities in dependent portions of the left-greater-than-right lower lobes, suspicious for aspiration or infection. 3. 13 x 7 mm solid nodule in the right upper lobe. Per Fleischner Society Guidelines, consider a non-contrast Chest CT at 3 months, a PET/CT, or tissue sampling. These guidelines do not apply to immunocompromised patients and patients with cancer. Follow up in patients with significant comorbidities as clinically warranted. Reference: Radiology. 2017; 284(1):228-43. Aortic Atherosclerosis (ICD10-I70.0) and Emphysema (ICD10-J43.9). Electronically Signed  By: Orvan Falconer M.D.   On: 06/13/2023 14:10   DG Chest Portable 1 View  Result Date: 06/13/2023 CLINICAL DATA:  Cough. EXAM: PORTABLE CHEST 1 VIEW COMPARISON:  06/11/2023 FINDINGS: Normal heart size and mediastinal contours. No signs of pleural effusion or edema. Asymmetric airspace disease is identified within the left lung base which appears similar to the previous exam. Right lung appears clear. Remote fracture deformity of the proximal right humerus. IMPRESSION: Persistent asymmetric airspace disease within the left base compatible with pneumonia. Electronically Signed   By: Signa Kell M.D.   On: 06/13/2023 11:25    Pending Labs Unresulted Labs (From admission, onward)     Start     Ordered   06/20/23 0500  Creatinine, serum  (enoxaparin (LOVENOX)    CrCl >/= 30 ml/min)  Weekly,   R     Comments: while on enoxaparin therapy    06/13/23 1458   06/14/23 0500  Comprehensive metabolic panel  Tomorrow  morning,   R        06/13/23 1458   06/14/23 0500  CBC  Tomorrow morning,   R        06/13/23 1458   06/13/23 1459  Magnesium  Add-on,   AD        06/13/23 1458   06/13/23 1436  Lactic acid, plasma  ONCE - STAT,   STAT        06/13/23 1435   06/13/23 1434  Blood culture (routine x 2)  BLOOD CULTURE X 2,   R (with STAT occurrences)      06/13/23 1433            Vitals/Pain Today's Vitals   06/13/23 1200 06/13/23 1230 06/13/23 1300 06/13/23 1330  BP: (!) 163/75 (!) 159/100 (!) 175/74 (!) 141/54  Pulse: (!) 108 (!) 106 87 (!) 106  Resp: (!) 24 (!) 22 (!) 26 (!) 24  Temp:      TempSrc:      SpO2: 95% 95% (!) 89% 94%  Weight:      Height:      PainSc:        Isolation Precautions No active isolations  Medications Medications  cefTRIAXone (ROCEPHIN) 1 g in sodium chloride 0.9 % 100 mL IVPB (1 g Intravenous New Bag/Given 06/13/23 1517)  azithromycin (ZITHROMAX) 500 mg in sodium chloride 0.9 % 250 mL IVPB (has no administration in time range)  losartan (COZAAR) tablet 25 mg (has no administration in time range)  brimonidine (ALPHAGAN) 0.15 % ophthalmic solution 1 drop (has no administration in time range)  latanoprost (XALATAN) 0.005 % ophthalmic solution 1 drop (has no administration in time range)  enoxaparin (LOVENOX) injection 40 mg (has no administration in time range)  cefTRIAXone (ROCEPHIN) 2 g in sodium chloride 0.9 % 100 mL IVPB (has no administration in time range)  azithromycin (ZITHROMAX) 500 mg in sodium chloride 0.9 % 250 mL IVPB (has no administration in time range)  ipratropium (ATROVENT) nebulizer solution 0.5 mg (has no administration in time range)  albuterol (PROVENTIL) (2.5 MG/3ML) 0.083% nebulizer solution 2.5 mg (has no administration in time range)  sodium chloride flush (NS) 0.9 % injection 3 mL (has no administration in time range)  acetaminophen (TYLENOL) tablet 650 mg (has no administration in time range)    Or  acetaminophen (TYLENOL) suppository  650 mg (has no administration in time range)  polyethylene glycol (MIRALAX / GLYCOLAX) packet 17 g (has no administration in time range)  methylPREDNISolone sodium succinate (  SOLU-MEDROL) 125 mg/2 mL injection 125 mg (125 mg Intravenous Given 06/13/23 1119)  albuterol (PROVENTIL) (2.5 MG/3ML) 0.083% nebulizer solution 5 mg (5 mg Nebulization Given 06/13/23 1119)  ipratropium (ATROVENT) nebulizer solution 0.5 mg (0.5 mg Nebulization Given 06/13/23 1119)  iohexol (OMNIPAQUE) 350 MG/ML injection 75 mL (75 mLs Intravenous Contrast Given 06/13/23 1358)  albuterol (PROVENTIL) (2.5 MG/3ML) 0.083% nebulizer solution 5 mg (5 mg Nebulization Given 06/13/23 1518)  ipratropium (ATROVENT) nebulizer solution 0.5 mg (0.5 mg Nebulization Given 06/13/23 1518)  potassium chloride SA (KLOR-CON M) CR tablet 40 mEq (40 mEq Oral Given 06/13/23 1518)    Mobility walks     Focused Assessments Pulmonary Assessment Handoff:  Lung sounds: Bilateral Breath Sounds: Inspiratory wheezes L Breath Sounds: Inspiratory wheezes R Breath Sounds: Inspiratory wheezes O2 Device: Nasal Cannula O2 Flow Rate (L/min): 2 L/min    R Recommendations: See Admitting Provider Note  Report given to:   Additional Notes:

## 2023-06-13 NOTE — H&P (Signed)
History and Physical   Annette Berry:[id number] DOB: 15-Aug-1941 DOA: 06/13/2023  PCP: Kaleen Mask, MD   Patient coming from: Home  Chief Complaint: Shortness of breath, cough  HPI: Annette Berry is a 82 y.o. female with medical history significant of CHF, hypertension, hard of hearing presenting with cough and shortness of breath.  Patient reports she has been experiencing some cough with associated shortness of breath for the past week.  Denies any known sick contacts. States she saw her PCP on Tuesday and as far as he knows he referred her to see someone else but does not remember if she received any new prescriptions.  Reports possible subjective fevers but has not taken her temperature. Denies fever, chills, chest pain, abdominal pain, constipation, diarrhea, nausea, vomiting.  ED Course: Vital signs in the ED notable for blood pressure in the 140s to 180s systolic, heart rate in the 80s to 120s, respiratory in the 20s, requiring 2 L to maintain saturations with saturations in the mid 80s on room air.  Lab workup included CMP with potassium 3.1, glucose 141, albumin 2.9.  CBC with leukocytosis to 22 and platelets 47.  BNP indeterminate at 264, lactic acid pending, COVID screen negative.  Blood cultures pending.  Chest x-ray showed persistent asymmetric airspace disease at the left base.  CTA PE study was negative for PE but did show consolidation of the left greater than right base suspicious for aspiration versus infection.  Also noted was a 13 x 7 mm nodule with recommendation for follow-up.  Patient received steroids, Atrovent, albuterol, ceftriaxone, azithromycin, potassium in the ED.  Review of Systems: As per HPI otherwise all other systems reviewed and are negative.  Past Medical History:  Diagnosis Date   Arthritis    CHF (congestive heart failure) (HCC)    HOH (hard of hearing)     Past Surgical History:  Procedure Laterality Date    INTRAMEDULLARY (IM) NAIL INTERTROCHANTERIC Left 09/07/2019   INTRAMEDULLARY (IM) NAIL INTERTROCHANTRIC (Left Hip)   INTRAMEDULLARY (IM) NAIL INTERTROCHANTERIC Left 09/07/2019   Procedure: INTRAMEDULLARY (IM) NAIL INTERTROCHANTRIC;  Surgeon: Roby Lofts, MD;  Location: [facility] OR;  Service: Orthopedics;  Laterality: Left;    Social History  reports that she has been smoking cigarettes. She has a 52.5 pack-year smoking history. She has never used smokeless tobacco. She reports that she does not currently use alcohol. She reports that she does not use drugs.  No Known Allergies  Family History  Problem Relation Age of Onset   Diabetes Mellitus I Neg Hx   Reviewed on admission  Prior to Admission medications   Medication Sig Start Date End Date Taking? Authorizing Provider  acetaminophen (TYLENOL) 650 MG CR tablet Take 650-1,300 mg by mouth in the morning and at bedtime.   Yes [provider]  brimonidine (ALPHAGAN) 0.15 % ophthalmic solution Place 1 drop into both eyes 2 (two) times daily.  08/20/19  Yes [provider]  cholecalciferol (VITAMIN D) 25 MCG tablet Take 2 tablets (2,000 Units total) by mouth 2 (two) times daily. Patient taking differently: Take 2,000 Units by mouth daily. 09/09/19  Yes Swayze, Ava, DO  latanoprost (XALATAN) 0.005 % ophthalmic solution Place 1 drop into both eyes at bedtime.  08/20/19  Yes [provider]  losartan (COZAAR) 25 MG tablet Take 1 tablet (25 mg total) by mouth daily. 01/14/21  Yes Christian, Rylee, MD  polyethylene glycol powder (MIRALAX) 17 GM/SCOOP powder Take 255 g by mouth daily. 09/09/19  Yes Swayze, Ava, DO  diclofenac Sodium (VOLTAREN) 1 % GEL Apply 4 g topically 4 (four) times daily. Patient not taking: Reported on 06/13/2023 06/07/20   Melene Plan, DO  feeding supplement, ENSURE ENLIVE, (ENSURE ENLIVE) LIQD Take 237 mLs by mouth 2 (two) times daily between meals. Patient not taking: Reported on 06/07/2020 09/10/19    Swayze, Ava, DO  potassium chloride SA (KLOR-CON) 20 MEQ tablet Take 1 tablet (20 mEq total) by mouth 2 (two) times daily. Patient not taking: Reported on 06/13/2023 01/14/21   Elige Radon, MD  traMADol (ULTRAM) 50 MG tablet Take 1 tablet (50 mg total) by mouth every 6 (six) hours as needed for moderate pain. Patient not taking: Reported on 06/13/2023 01/14/21   Elige Radon, MD    Physical Exam: Vitals:   06/13/23 1200 06/13/23 1230 06/13/23 1300 06/13/23 1330  BP: (!) 163/75 (!) 159/100 (!) 175/74 (!) 141/54  Pulse: (!) 108 (!) 106 87 (!) 106  Resp: (!) 24 (!) 22 (!) 26 (!) 24  Temp:      TempSrc:      SpO2: 95% 95% (!) 89% 94%  Weight:      Height:        Physical Exam Constitutional:      General: She is not in acute distress.    Appearance: Normal appearance.  HENT:     Head: Normocephalic and atraumatic.     Mouth/Throat:     Mouth: Mucous membranes are moist.     Pharynx: Oropharynx is clear.  Eyes:     Extraocular Movements: Extraocular movements intact.     Pupils: Pupils are equal, round, and reactive to light.  Cardiovascular:     Rate and Rhythm: Regular rhythm. Tachycardia present.     Pulses: Normal pulses.     Heart sounds: Normal heart sounds.  Pulmonary:     Effort: Pulmonary effort is normal. No respiratory distress.     Breath sounds: Wheezing (Trace) present.  Abdominal:     General: Bowel sounds are normal. There is no distension.     Palpations: Abdomen is soft.     Tenderness: There is no abdominal tenderness.  Musculoskeletal:        General: No swelling or deformity.  Skin:    General: Skin is warm and dry.  Neurological:     General: No focal deficit present.     Mental Status: Mental status is at baseline.    Labs on Admission: I have personally reviewed following labs and imaging studies  CBC: Recent Labs  Lab 06/13/23 1053  WBC 22.0*  HGB 15.0  HCT 45.0  MCV 88.2  PLT 407*    Basic Metabolic Panel: Recent Labs  Lab  06/13/23 1053  NA 137  K 3.1*  CL 99  CO2 26  GLUCOSE 141*  BUN 12  CREATININE 0.72  CALCIUM 9.4    GFR: Estimated Creatinine Clearance: 47.6 mL/min (by C-G formula based on SCr of 0.72 mg/dL).  Liver Function Tests: Recent Labs  Lab 06/13/23 1053  AST 28  ALT 15  ALKPHOS 80  BILITOT 1.1  PROT 7.1  ALBUMIN 2.9*    Urine analysis:    Component Value Date/Time   COLORURINE YELLOW 01/11/2021 1956   APPEARANCEUR CLEAR 01/11/2021 1956   LABSPEC 1.020 01/11/2021 1956   PHURINE 5.0 01/11/2021 1956   GLUCOSEU NEGATIVE 01/11/2021 1956   HGBUR NEGATIVE 01/11/2021 1956   BILIRUBINUR NEGATIVE 01/11/2021 1956   KETONESUR 5 (A) 01/11/2021 1956  PROTEINUR NEGATIVE 01/11/2021 1956   NITRITE NEGATIVE 01/11/2021 1956   LEUKOCYTESUR NEGATIVE 01/11/2021 1956    Radiological Exams on Admission: CT Angio Chest PE W/Cm &/Or Wo Cm  Result Date: 06/13/2023 CLINICAL DATA:  Pulmonary embolism (PE) suspected, low to intermediate prob, positive D-dimer Pulmonary embolism (PE) suspected, high prob. EXAM: CT ANGIOGRAPHY CHEST WITH CONTRAST TECHNIQUE: Multidetector CT imaging of the chest was performed using the standard protocol during bolus administration of intravenous contrast. Multiplanar CT image reconstructions and MIPs were obtained to evaluate the vascular anatomy. RADIATION DOSE REDUCTION: This exam was performed according to the departmental dose-optimization program which includes automated exposure control, adjustment of the mA and/or kV according to patient size and/or use of iterative reconstruction technique. CONTRAST:  75mL OMNIPAQUE IOHEXOL 350 MG/ML SOLN COMPARISON:  Chest radiographs 06/13/2023. FINDINGS: Cardiovascular: Satisfactory opacification of the pulmonary arteries to the segmental level. No evidence of pulmonary embolism. Extensive coronary artery calcifications. Atherosclerotic calcifications of the thoracic aorta. Mediastinum/Nodes: No enlarged mediastinal, hilar, or  axillary lymph nodes. Thyroid gland, trachea, and esophagus demonstrate no significant findings. Lungs/Pleura: Moderate centrilobular emphysema. 13 x 7 mm solid nodule in the right upper lobe (axial image 36 series 7). Consolidative opacities in dependent portions of the left-greater-than-right lower lobes, suspicious for aspiration or infection. No pleural effusion or pneumothorax. Upper Abdomen: No acute abnormality. Musculoskeletal: No chest wall abnormality. No acute or significant osseous findings. Review of the MIP images confirms the above findings. IMPRESSION: 1. No evidence of pulmonary embolism. 2. Consolidative opacities in dependent portions of the left-greater-than-right lower lobes, suspicious for aspiration or infection. 3. 13 x 7 mm solid nodule in the right upper lobe. Per Fleischner Society Guidelines, consider a non-contrast Chest CT at 3 months, a PET/CT, or tissue sampling. These guidelines do not apply to immunocompromised patients and patients with cancer. Follow up in patients with significant comorbidities as clinically warranted. Reference: Radiology. 2017; 284(1):228-43. Aortic Atherosclerosis (ICD10-I70.0) and Emphysema (ICD10-J43.9). Electronically Signed   By: Orvan Falconer M.D.   On: 06/13/2023 14:10   DG Chest Portable 1 View  Result Date: 06/13/2023 CLINICAL DATA:  Cough. EXAM: PORTABLE CHEST 1 VIEW COMPARISON:  06/11/2023 FINDINGS: Normal heart size and mediastinal contours. No signs of pleural effusion or edema. Asymmetric airspace disease is identified within the left lung base which appears similar to the previous exam. Right lung appears clear. Remote fracture deformity of the proximal right humerus. IMPRESSION: Persistent asymmetric airspace disease within the left base compatible with pneumonia. Electronically Signed   By: Signa Kell M.D.   On: 06/13/2023 11:25    EKG: Independently reviewed.  Sinus tachycardia at 108 bpm.  Some PAC, nonspecific T wave  flattening.  Assessment/Plan Principal Problem:   Acute respiratory failure with hypoxia (HCC) Active Problems:   HTN (hypertension)   CAP (community acquired pneumonia)   Acute respiratory failure with hypoxia Pneumonia ?Undiagnosed COPD, with exacerbation > Presented with a week of cough and shortness of breath.  Noted to have some wheezing on exam as well.  Chest x-ray with left greater than right airspace disease.  CT PE study was negative for PE but confirmed left greater than right base consolidation suspicious for aspiration versus infection.  Leukocytosis to 22. > Started on ceftriaxone and azithromycin in the ED for pneumonia.  Did receive steroids and nebulizers for possible undiagnosed COPD in exacerbation. - Monitor on telemetry overnight - Continue ceftriaxone and azithromycin - Trend fever curve and WBC - Follow-up blood cultures - Continue with supplemental oxygen,  wean as tolerated - Continue scheduled Atrovent and as needed albuterol - Hold off on further steroids at this time  Hypertension - Continue home losartan  CT abnormality > Nodule noted on CT recommending follow-up  DVT prophylaxis: Lovenox Code Status:   Full Family Communication:  None on admission Disposition Plan:   Patient is from:  Home  Anticipated DC to:  Home  Anticipated DC date:  1 to 2 days  Anticipated DC barriers: None  Consults called:  None Admission status:  Observation, telemetry  Severity of Illness: The appropriate patient status for this patient is OBSERVATION. Observation status is judged to be reasonable and necessary in order to provide the required intensity of service to ensure the patient's safety. The patient's presenting symptoms, physical exam findings, and initial radiographic and laboratory data in the context of their medical condition is felt to place them at decreased risk for further clinical deterioration. Furthermore, it is anticipated that the patient will be  medically stable for discharge from the hospital within 2 midnights of admission.    Synetta Fail MD [facility]  How to contact the [facility] Attending or Consulting provider 7A - 7P or covering provider during after hours 7P -7A, for this patient?   Check the care team in [facility] and look for a) attending/consulting [facility] provider listed and b) the [facility] team listed Log into [email or web address] and use [place]'s universal password to access. If you do not have the password, please contact the hospital operator. Locate the [facility] provider you are looking for under [facility] and page to a number that you can be directly reached. If you still have difficulty reaching the provider, please page the [facility] (Director on Call) for the Hospitalists listed on amion for assistance.  06/13/2023, 2:59 PM

## 2023-06-14 ENCOUNTER — Observation Stay (HOSPITAL_COMMUNITY): Payer: Medicare PPO

## 2023-06-14 DIAGNOSIS — E872 Acidosis, unspecified: Secondary | ICD-10-CM | POA: Diagnosis present

## 2023-06-14 DIAGNOSIS — J440 Chronic obstructive pulmonary disease with acute lower respiratory infection: Secondary | ICD-10-CM | POA: Diagnosis present

## 2023-06-14 DIAGNOSIS — J9601 Acute respiratory failure with hypoxia: Secondary | ICD-10-CM | POA: Diagnosis present

## 2023-06-14 DIAGNOSIS — R6520 Severe sepsis without septic shock: Secondary | ICD-10-CM | POA: Diagnosis not present

## 2023-06-14 DIAGNOSIS — R911 Solitary pulmonary nodule: Secondary | ICD-10-CM | POA: Diagnosis present

## 2023-06-14 DIAGNOSIS — I110 Hypertensive heart disease with heart failure: Secondary | ICD-10-CM | POA: Diagnosis present

## 2023-06-14 DIAGNOSIS — Z79899 Other long term (current) drug therapy: Secondary | ICD-10-CM | POA: Diagnosis not present

## 2023-06-14 DIAGNOSIS — J189 Pneumonia, unspecified organism: Secondary | ICD-10-CM | POA: Diagnosis present

## 2023-06-14 DIAGNOSIS — I5033 Acute on chronic diastolic (congestive) heart failure: Secondary | ICD-10-CM | POA: Diagnosis not present

## 2023-06-14 DIAGNOSIS — A419 Sepsis, unspecified organism: Secondary | ICD-10-CM | POA: Diagnosis not present

## 2023-06-14 DIAGNOSIS — R062 Wheezing: Secondary | ICD-10-CM | POA: Diagnosis present

## 2023-06-14 DIAGNOSIS — H409 Unspecified glaucoma: Secondary | ICD-10-CM | POA: Diagnosis present

## 2023-06-14 DIAGNOSIS — I5031 Acute diastolic (congestive) heart failure: Secondary | ICD-10-CM

## 2023-06-14 DIAGNOSIS — F17210 Nicotine dependence, cigarettes, uncomplicated: Secondary | ICD-10-CM | POA: Diagnosis present

## 2023-06-14 DIAGNOSIS — E876 Hypokalemia: Secondary | ICD-10-CM | POA: Diagnosis present

## 2023-06-14 DIAGNOSIS — J441 Chronic obstructive pulmonary disease with (acute) exacerbation: Secondary | ICD-10-CM | POA: Diagnosis present

## 2023-06-14 DIAGNOSIS — Z1152 Encounter for screening for COVID-19: Secondary | ICD-10-CM | POA: Diagnosis not present

## 2023-06-14 DIAGNOSIS — J9691 Respiratory failure, unspecified with hypoxia: Secondary | ICD-10-CM | POA: Diagnosis present

## 2023-06-14 LAB — ECHOCARDIOGRAM COMPLETE
AR max vel: 1.08 cm2
AV Area VTI: 1.15 cm2
AV Area mean vel: 1.1 cm2
AV Mean grad: 6.5 mmHg
AV Peak grad: 12 mmHg
Ao pk vel: 1.73 m/s
Area-P 1/2: 4.21 cm2
Height: 64 in
S' Lateral: 2.8 cm
Weight: 2433.88 oz

## 2023-06-14 LAB — CBC
HCT: 38.2 % (ref 36.0–46.0)
Hemoglobin: 13 g/dL (ref 12.0–15.0)
MCH: 29.9 pg (ref 26.0–34.0)
MCHC: 34 g/dL (ref 30.0–36.0)
MCV: 87.8 fL (ref 80.0–100.0)
Platelets: 363 10*3/uL (ref 150–400)
RBC: 4.35 MIL/uL (ref 3.87–5.11)
RDW: 13.2 % (ref 11.5–15.5)
WBC: 22.8 10*3/uL — ABNORMAL HIGH (ref 4.0–10.5)
nRBC: 0 % (ref 0.0–0.2)

## 2023-06-14 LAB — COMPREHENSIVE METABOLIC PANEL
ALT: 15 U/L (ref 0–44)
AST: 20 U/L (ref 15–41)
Albumin: 2.3 g/dL — ABNORMAL LOW (ref 3.5–5.0)
Alkaline Phosphatase: 65 U/L (ref 38–126)
Anion gap: 10 (ref 5–15)
BUN: 13 mg/dL (ref 8–23)
CO2: 26 mmol/L (ref 22–32)
Calcium: 8.9 mg/dL (ref 8.9–10.3)
Chloride: 103 mmol/L (ref 98–111)
Creatinine, Ser: 0.65 mg/dL (ref 0.44–1.00)
GFR, Estimated: >60 mL/min (ref >60)
Glucose, Bld: 158 mg/dL — ABNORMAL HIGH (ref 70–99)
Potassium: 3.8 mmol/L (ref 3.5–5.1)
Sodium: 139 mmol/L (ref 135–145)
Total Bilirubin: 0.4 mg/dL (ref 0.3–1.2)
Total Protein: 6.2 g/dL — ABNORMAL LOW (ref 6.5–8.1)

## 2023-06-14 LAB — CULTURE, BLOOD (ROUTINE X 2)

## 2023-06-14 LAB — PROCALCITONIN: Procalcitonin: <0.1 ng/mL

## 2023-06-14 MED ORDER — CARVEDILOL 3.125 MG PO TABS
3.1250 mg | ORAL_TABLET | Freq: Two times a day (BID) | ORAL | Status: DC
  Administered 2023-06-14 – 2023-06-16 (×4): 3.125 mg via ORAL
  Filled 2023-06-14 (×4): qty 1

## 2023-06-14 MED ORDER — IPRATROPIUM BROMIDE 0.02 % IN SOLN
0.5000 mg | Freq: Three times a day (TID) | RESPIRATORY_TRACT | Status: DC
  Administered 2023-06-14: 0.5 mg via RESPIRATORY_TRACT

## 2023-06-14 MED ORDER — FUROSEMIDE 10 MG/ML IJ SOLN
INTRAMUSCULAR | Status: AC
  Administered 2023-06-14: 20 mg via INTRAVENOUS
  Filled 2023-06-14: qty 4

## 2023-06-14 MED ORDER — FUROSEMIDE 10 MG/ML IJ SOLN: 20.0000 mg | Freq: Once | INTRAMUSCULAR | Status: AC

## 2023-06-14 NOTE — Care Management Obs Status (Cosign Needed)
MEDICARE OBSERVATION STATUS NOTIFICATION   Patient Details  Name: Annette Berry MRN: [id number] Date of Birth: 03/26/1941   Medicare Observation Status Notification Given:  Yes    Janae Bridgeman, RN 06/14/2023, 12:29 PM

## 2023-06-14 NOTE — Plan of Care (Signed)

## 2023-06-14 NOTE — Progress Notes (Signed)
Transition of Care [facility]) - Inpatient Brief Assessment   Patient Details  Name: Annette Berry MRN: [id number] Date of Birth: 1941-02-19  Transition of Care [facility]) CM/SW Contact:    Janae Bridgeman, RN Phone Number: 06/14/2023, 12:42 PM   Clinical Narrative: CM met with the patient at the bedside to discuss TOC needs.  The patient was admitted from home for ARF with hypoxia and is currently on 3L/min Ward.   The patient quit smoking years ago.  Patient states that she lives alone but a neighbor comes and stays with her in the home as needed.  The patient wants to return home with home health services if she is able.   Medicare Observation letter was provided to the patient at the bedside.  DME at the home includes RW and wheelchair ramp.  Patient does not use a nebulizer machine at the home but uses inhalers.  Patient is a nonsmoker.  CM will continue to follow the patient for [facility] needs - if patient needs [facility] services - she does not have a preference for agencies to provide.   Transition of Care Asessment: Insurance and Status: (P) Insurance coverage has been reviewed Patient has primary care physician: (P) Yes Home environment has been reviewed: (P) Yes - lives at home alone Prior level of function:: (P) Independent Prior/Current Home Services: (P) No current home services Social Determinants of Health Reivew: (P) SDOH reviewed needs interventions Readmission risk has been reviewed: (P) Yes Transition of care needs: (P) transition of care needs identified, TOC will continue to follow

## 2023-06-14 NOTE — Progress Notes (Signed)
[facility]                                                                              [name], is a 82 y.o. female, DOB - 12-13-1940, YNW:[id number] Admit date - 06/13/2023    Outpatient Primary MD for the patient is Kaleen Mask, MD  LOS - 0  days  Chief Complaint  Patient presents with   Cough       Brief summary   Patient is a  82 y.o. female with CHF, hypertension, hard of hearing presented with cough and shortness of breath.  Patient reported she has been experiencing some cough with associated shortness of breath for the past week.  Denies any known sick contacts. States she saw her PCP on Tuesday and as far as he knows he referred her to see someone else but does not remember if she received any new prescriptions.   Reports possible subjective fevers but has not taken her temperature. Denies fever, chills, chest pain, abdominal pain, constipation, diarrhea, nausea, vomiting. Chest x-ray showed persistent asymmetric airspace disease at the left base. CTA PE study was negative for PE but did show consolidation of the left greater than right base suspicious for aspiration versus infection. Also noted was a 13 x 7 mm nodule with recommendation for follow-up. Patient received steroids, Atrovent, albuterol, ceftriaxone, azithromycin, K in the ED.    Assessment & Plan    Principal Problem:   Acute respiratory failure with hypoxia (HCC) - likely due to multifocal pneumonia, may have underlying undiagnosed COPD, ?CHF  - wean O2 as tolerated, currently on 3L o2, not on any O2 at baseline   - met SIRS criteria with tachycardia, mild tachypnea, lactic acidosis, hypoxia, source likey PNA   - BNP 265.4, will check echo, lasix 20mg  IV x 1    Active Problems:   CAP (community acquired pneumonia)/ multifocal pneumonia  - r/o aspiration - continue IV azithromax, rocephin  - continue albuterol nebs, flutter valve, O2      HTN (hypertension) -  elevated BP with tachycardia - f/u 2D echo - continue losartan, added coreg 3.125mg  BID    13 x 7 mm RUL nodule - incidental finding on CT scan, recommended 3 month follow up with noncon CT or PET-CT    Estimated body mass index is 26.11 kg/m as calculated from the following:   Height as of this encounter: 5\' 4"  (1.626 m).   Weight as of this encounter: 69 kg.  Code Status: full  DVT Prophylaxis:  enoxaparin (LOVENOX) injection 40 mg Start: 06/13/23 2200   Level of Care: Level of care: Telemetry Medical Family Communication: Updated patient Disposition Plan:      Remains inpatient appropriate:  w/u in progress    Procedures:    Consultants:     Antimicrobials:   Anti-infectives (From admission, onward)    Start     Dose/Rate Route Frequency Ordered Stop   06/14/23 1600  azithromycin (ZITHROMAX) 500 mg in sodium chloride 0.9 % 250 mL IVPB        500 mg 250 mL/hr over 60 Minutes Intravenous Every 24 hours 06/13/23  1458 06/19/23 1559   06/14/23 1000  cefTRIAXone (ROCEPHIN) 2 g in sodium chloride 0.9 % 100 mL IVPB        2 g 200 mL/hr over 30 Minutes Intravenous Every 24 hours 06/13/23 1458 06/19/23 0959   06/13/23 1600  cefTRIAXone (ROCEPHIN) 1 g in sodium chloride 0.9 % 100 mL IVPB        1 g 200 mL/hr over 30 Minutes Intravenous  Once 06/13/23 1554 06/13/23 1804   06/13/23 1445  cefTRIAXone (ROCEPHIN) 1 g in sodium chloride 0.9 % 100 mL IVPB        1 g 200 mL/hr over 30 Minutes Intravenous  Once 06/13/23 1433 06/13/23 1555   06/13/23 1445  azithromycin (ZITHROMAX) 500 mg in sodium chloride 0.9 % 250 mL IVPB        500 mg 250 mL/hr over 60 Minutes Intravenous  Once 06/13/23 1433 06/13/23 1657          Medications  brimonidine  1 drop Both Eyes BID   enoxaparin (LOVENOX) injection  40 mg Subcutaneous QHS   latanoprost  1 drop Both Eyes QHS   losartan  25 mg Oral Daily   sodium chloride flush  3 mL Intravenous Q12H      Subjective:   Annette Berry was seen and examined today.  Hard of hearing, currently on 3L O2. No fevers, chest pain, + cough.  Patient denies dizziness, abdominal pain, N/V.  Objective:   Vitals:   06/14/23 0506 06/14/23 0807 06/14/23 0910 06/14/23 1215  BP:  (!) 157/73  (!) 117/57  Pulse:  96  79  Resp: (!) 24 16  16   Temp:  98.1 F (36.7 C)  98.2 F (36.8 C)  TempSrc:  Oral  Oral  SpO2:  96% 95% 95%  Weight: 69 kg     Height:        Intake/Output Summary (Last 24 hours) at 06/14/2023 1244 Last data filed at 06/13/2023 1555 Gross per 24 hour  Intake 100 ml  Output --  Net 100 ml     Wt Readings from Last 3 Encounters:  06/14/23 69 kg  01/11/21 63.5 kg  06/07/20 59 kg     Exam General: Alert and oriented x 3, NAD Cardiovascular: S1 S2 auscultated,  RRR Respiratory: b/l scattered rhonchi  Gastrointestinal: Soft, nontender, nondistended, + bowel sounds Ext: no pedal edema bilaterally Neuro: no new deficits  Psych: Normal affect     Data Reviewed:  I have personally reviewed following labs    CBC Lab Results  Component Value Date   WBC 22.8 (H) 06/14/2023   RBC 4.35 06/14/2023   HGB 13.0 06/14/2023   HCT 38.2 06/14/2023   MCV 87.8 06/14/2023   MCH 29.9 06/14/2023   PLT 363 06/14/2023   MCHC 34.0 06/14/2023   RDW 13.2 06/14/2023   LYMPHSABS 1.2 01/14/2021   MONOABS 0.5 01/14/2021   EOSABS 0.0 01/14/2021   BASOSABS 0.0 01/14/2021     Last metabolic panel Lab Results  Component Value Date   NA 139 06/14/2023   K 3.8 06/14/2023   CL 103 06/14/2023   CO2 26 06/14/2023   BUN 13 06/14/2023   CREATININE 0.65 06/14/2023   GLUCOSE 158 (H) 06/14/2023   GFRNONAA >60 06/14/2023   GFRAA >60 09/09/2019   CALCIUM 8.9 06/14/2023   PHOS 4.2 01/14/2021   PROT 6.2 (L) 06/14/2023   ALBUMIN 2.3 (L) 06/14/2023   BILITOT 0.4 06/14/2023   ALKPHOS 65 06/14/2023  AST 20 06/14/2023   ALT 15 06/14/2023   ANIONGAP 10 06/14/2023    CBG (last 3)  No results for input(s):  "GLUCAP" in the last 72 hours.    Coagulation Profile: No results for input(s): "INR", "PROTIME" in the last 168 hours.   Radiology Studies: I have personally reviewed the imaging studies  CT Angio Chest PE W/Cm &/Or Wo Cm  Result Date: 06/13/2023 CLINICAL DATA:  Pulmonary embolism (PE) suspected, low to intermediate prob, positive D-dimer Pulmonary embolism (PE) suspected, high prob. EXAM: CT ANGIOGRAPHY CHEST WITH CONTRAST TECHNIQUE: Multidetector CT imaging of the chest was performed using the standard protocol during bolus administration of intravenous contrast. Multiplanar CT image reconstructions and MIPs were obtained to evaluate the vascular anatomy. RADIATION DOSE REDUCTION: This exam was performed according to the departmental dose-optimization program which includes automated exposure control, adjustment of the mA and/or kV according to patient size and/or use of iterative reconstruction technique. CONTRAST:  75mL OMNIPAQUE IOHEXOL 350 MG/ML SOLN COMPARISON:  Chest radiographs 06/13/2023. FINDINGS: Cardiovascular: Satisfactory opacification of the pulmonary arteries to the segmental level. No evidence of pulmonary embolism. Extensive coronary artery calcifications. Atherosclerotic calcifications of the thoracic aorta. Mediastinum/Nodes: No enlarged mediastinal, hilar, or axillary lymph nodes. Thyroid gland, trachea, and esophagus demonstrate no significant findings. Lungs/Pleura: Moderate centrilobular emphysema. 13 x 7 mm solid nodule in the right upper lobe (axial image 36 series 7). Consolidative opacities in dependent portions of the left-greater-than-right lower lobes, suspicious for aspiration or infection. No pleural effusion or pneumothorax. Upper Abdomen: No acute abnormality. Musculoskeletal: No chest wall abnormality. No acute or significant osseous findings. Review of the MIP images confirms the above findings. IMPRESSION: 1. No evidence of pulmonary embolism. 2. Consolidative  opacities in dependent portions of the left-greater-than-right lower lobes, suspicious for aspiration or infection. 3. 13 x 7 mm solid nodule in the right upper lobe. Per Fleischner Society Guidelines, consider a non-contrast Chest CT at 3 months, a PET/CT, or tissue sampling. These guidelines do not apply to immunocompromised patients and patients with cancer. Follow up in patients with significant comorbidities as clinically warranted. Reference: Radiology. 2017; 284(1):228-43. Aortic Atherosclerosis (ICD10-I70.0) and Emphysema (ICD10-J43.9). Electronically Signed   By: Orvan Falconer M.D.   On: 06/13/2023 14:10   DG Chest Portable 1 View  Result Date: 06/13/2023 CLINICAL DATA:  Cough. EXAM: PORTABLE CHEST 1 VIEW COMPARISON:  06/11/2023 FINDINGS: Normal heart size and mediastinal contours. No signs of pleural effusion or edema. Asymmetric airspace disease is identified within the left lung base which appears similar to the previous exam. Right lung appears clear. Remote fracture deformity of the proximal right humerus. IMPRESSION: Persistent asymmetric airspace disease within the left base compatible with pneumonia. Electronically Signed   By: Signa Kell M.D.   On: 06/13/2023 11:25       Melinda Gwinner M.D. [facility] 06/14/2023, 12:44 PM  Available via Epic secure chat 7am-7pm After 7 pm, please refer to night coverage provider listed on amion.

## 2023-06-15 DIAGNOSIS — J189 Pneumonia, unspecified organism: Secondary | ICD-10-CM | POA: Diagnosis not present

## 2023-06-15 DIAGNOSIS — J9601 Acute respiratory failure with hypoxia: Secondary | ICD-10-CM | POA: Diagnosis not present

## 2023-06-15 DIAGNOSIS — E876 Hypokalemia: Secondary | ICD-10-CM | POA: Diagnosis not present

## 2023-06-15 LAB — CBC
HCT: 41.1 % (ref 36.0–46.0)
Hemoglobin: 13.7 g/dL (ref 12.0–15.0)
MCH: 29.4 pg (ref 26.0–34.0)
MCHC: 33.3 g/dL (ref 30.0–36.0)
MCV: 88.2 fL (ref 80.0–100.0)
Platelets: 361 10*3/uL (ref 150–400)
RBC: 4.66 MIL/uL (ref 3.87–5.11)
RDW: 13.4 % (ref 11.5–15.5)
WBC: 17.7 10*3/uL — ABNORMAL HIGH (ref 4.0–10.5)
nRBC: 0 % (ref 0.0–0.2)

## 2023-06-15 LAB — BASIC METABOLIC PANEL
Anion gap: 10 (ref 5–15)
BUN: 17 mg/dL (ref 8–23)
CO2: 25 mmol/L (ref 22–32)
Calcium: 8.6 mg/dL — ABNORMAL LOW (ref 8.9–10.3)
Chloride: 102 mmol/L (ref 98–111)
Creatinine, Ser: 0.58 mg/dL (ref 0.44–1.00)
GFR, Estimated: >60 mL/min (ref >60)
Glucose, Bld: 111 mg/dL — ABNORMAL HIGH (ref 70–99)
Potassium: 3.4 mmol/L — ABNORMAL LOW (ref 3.5–5.1)
Sodium: 137 mmol/L (ref 135–145)

## 2023-06-15 LAB — CULTURE, BLOOD (ROUTINE X 2)

## 2023-06-15 MED ORDER — BENZONATATE 100 MG PO CAPS
200.0000 mg | ORAL_CAPSULE | Freq: Three times a day (TID) | ORAL | Status: DC
  Administered 2023-06-15 – 2023-06-20 (×15): 200 mg via ORAL
  Filled 2023-06-15 (×15): qty 2

## 2023-06-15 MED ORDER — AZITHROMYCIN 500 MG PO TABS
500.0000 mg | ORAL_TABLET | Freq: Every day | ORAL | Status: AC
  Administered 2023-06-15 – 2023-06-17 (×3): 500 mg via ORAL
  Filled 2023-06-15 (×3): qty 1

## 2023-06-15 MED ORDER — POTASSIUM CHLORIDE CRYS ER 20 MEQ PO TBCR
40.0000 meq | EXTENDED_RELEASE_TABLET | Freq: Once | ORAL | Status: AC
  Administered 2023-06-15: 40 meq via ORAL
  Filled 2023-06-15: qty 2

## 2023-06-15 MED ORDER — MELATONIN 3 MG PO TABS
3.0000 mg | ORAL_TABLET | Freq: Every day | ORAL | Status: DC
  Administered 2023-06-15 – 2023-06-19 (×5): 3 mg via ORAL
  Filled 2023-06-15 (×5): qty 1

## 2023-06-15 MED ORDER — GUAIFENESIN-DM 100-10 MG/5ML PO SYRP
5.0000 mL | ORAL_SOLUTION | ORAL | Status: DC | PRN
  Administered 2023-06-17: 5 mL via ORAL
  Filled 2023-06-15: qty 10

## 2023-06-15 MED ORDER — FUROSEMIDE 10 MG/ML IJ SOLN
20.0000 mg | Freq: Every day | INTRAMUSCULAR | Status: AC
  Administered 2023-06-15 – 2023-06-16 (×2): 20 mg via INTRAVENOUS
  Filled 2023-06-15 (×2): qty 2

## 2023-06-15 NOTE — Plan of Care (Signed)

## 2023-06-15 NOTE — Evaluation (Signed)
Clinical/Bedside Swallow Evaluation Patient Details  Name: Annette Berry MRN: [id number] Date of Birth: May 11, 1941  Today's Date: 06/15/2023 Time: SLP Start Time (ACUTE ONLY): 0915 SLP Stop Time (ACUTE ONLY): 0926 SLP Time Calculation (min) (ACUTE ONLY): 11 min  Past Medical History:  Past Medical History:  Diagnosis Date   Arthritis    CHF (congestive heart failure) (HCC)    HOH (hard of hearing)    Past Surgical History:  Past Surgical History:  Procedure Laterality Date   INTRAMEDULLARY (IM) NAIL INTERTROCHANTERIC Left 09/07/2019   INTRAMEDULLARY (IM) NAIL INTERTROCHANTRIC (Left Hip)   INTRAMEDULLARY (IM) NAIL INTERTROCHANTERIC Left 09/07/2019   Procedure: INTRAMEDULLARY (IM) NAIL INTERTROCHANTRIC;  Surgeon: Roby Lofts, MD;  Location: [facility] OR;  Service: Orthopedics;  Laterality: Left;   HPI:  Annette Berry is an 82 y.o. female who presented with cough and shortness of breath.   Patient reported she has been experiencing some cough with associated shortness of breath for the past week.  Denies any known sick contacts. CXR 7/18: "Persistent asymmetric airspace disease within the left base compatible with pneumonia." Chest CT 7/18: "Consolidative opacities in dependent portions of the left-greater-than-right lower lobes, suspicious for aspiration or infection."  Pt with hx CHF, hypertension, hard of hearing.    Assessment / Plan / Recommendation  Clinical Impression  Pt presents with functional swallowing as assessed clinically.  Pt tolerated all consistencies trialed, including serial straw sips of thin liquid, without any clinical s/s of aspiration and exhibited good oral clearance of solids despite edentulism.  However, a concern for prandial aspiration still exists given abnormal chest imaging. Spoke with MD via secure chat and we will plan for MBSS to rule out silent aspiraiton.  Pt may continue oral diet at this time.  If mechanical soft diet is unappetizing, pt  may advance to regular textures if desired. Pt reports her dentures are at home.   Recommend mechanical soft solids with thin liquids.  SLP Visit Diagnosis: Dysphagia, unspecified (R13.10)    Aspiration Risk  Mild aspiration risk    Diet Recommendation Dysphagia 3 (Mech soft);Thin liquid    Liquid Administration via: Cup;Straw Medication Administration: Whole meds with liquid Compensations: Slow rate;Small sips/bites Postural Changes: Seated upright at 90 degrees    Other  Recommendations Oral Care Recommendations: Oral care BID    Recommendations for follow up therapy are one component of a multi-disciplinary discharge planning process, led by the attending physician.  Recommendations may be updated based on patient status, additional functional criteria and insurance authorization.  Follow up Recommendations  (TBD)      Assistance Recommended at Discharge  N/A  Functional Status Assessment  (TBD)  Frequency and Duration  (TBD)          Prognosis Prognosis for improved oropharyngeal function:  (TBD)      Swallow Study   General Date of Onset: 06/13/23 HPI: Annette Berry is an 82 y.o. female who presented with cough and shortness of breath.   Patient reported she has been experiencing some cough with associated shortness of breath for the past week.  Denies any known sick contacts. CXR 7/18: "Persistent asymmetric airspace disease within the left base compatible with pneumonia." Chest CT 7/18: "Consolidative opacities in dependent portions of the left-greater-than-right lower lobes, suspicious for aspiration or infection."  Pt with hx CHF, hypertension, hard of hearing. Type of Study: Bedside Swallow Evaluation Previous Swallow Assessment: None Diet Prior to this Study: Regular;Thin liquids (Level 0) Temperature Spikes Noted: No Respiratory Status: Nasal  cannula History of Recent Intubation: No Behavior/Cognition: Alert;Cooperative;Pleasant mood Oral Cavity  Assessment: Within Functional Limits Oral Care Completed by SLP: No Oral Cavity - Dentition: Edentulous;Dentures, not available Vision: Functional for self-feeding Self-Feeding Abilities: Able to feed self Patient Positioning: Upright in bed Baseline Vocal Quality: Normal Volitional Cough: Strong    Oral/Motor/Sensory Function Overall Oral Motor/Sensory Function: Within functional limits Facial ROM: Within Functional Limits Facial Symmetry: Within Functional Limits Lingual ROM: Within Functional Limits Lingual Symmetry: Within Functional Limits Lingual Strength: Within Functional Limits Velum: Within Functional Limits Mandible: Within Functional Limits   Ice Chips Ice chips: Not tested   Thin Liquid Thin Liquid: Within functional limits Presentation: Straw    Nectar Thick Nectar Thick Liquid: Not tested   Honey Thick Honey Thick Liquid: Not tested   Puree Puree: Within functional limits Presentation: Spoon   Solid     Solid: Within functional limits Presentation: Self Fed      Annette Pleasure, MA, CCC-SLP Acute Rehabilitation Services Office: [phone] 06/15/2023,9:44 AM

## 2023-06-15 NOTE — Progress Notes (Signed)
[facility]                                                                              Annette Berry, is a 82 y.o. female, DOB - 02-28-1941, ZOX:[id number] Admit date - 06/13/2023    Outpatient Primary MD for the patient is Kaleen Mask, MD  LOS - 1  days  Chief Complaint  Patient presents with   Cough       Brief summary   Patient is a  82 y.o. female with CHF, hypertension, hard of hearing presented with cough and shortness of breath.  Patient reported she has been experiencing some cough with associated shortness of breath for the past week.  Denies any known sick contacts. States she saw her PCP on Tuesday and as far as he knows he referred her to see someone else but does not remember if she received any new prescriptions.   Reports possible subjective fevers but has not taken her temperature. Denies fever, chills, chest pain, abdominal pain, constipation, diarrhea, nausea, vomiting. Chest x-ray showed persistent asymmetric airspace disease at the left base. CTA PE study was negative for PE but did show consolidation of the left greater than right base suspicious for aspiration versus infection. Also noted was a 13 x 7 mm nodule with recommendation for follow-up. Patient received steroids, Atrovent, albuterol, ceftriaxone, azithromycin, K in the ED.    Assessment & Plan    Principal Problem:   Acute respiratory failure with hypoxia (HCC) - likely due to multifocal pneumonia, may have underlying undiagnosed COPD, ?CHF  - met SIRS criteria with tachycardia, mild tachypnea, lactic acidosis, hypoxia, source likey PNA   -O2 sats 97% on 3 L, wean O2 as tolerated.  Not on O2 at baseline - BNP 265.4, 2D echo showed EF of 60 to 65%, G1 DD normal right ventricular systolic function.  Active Problems:   CAP (community acquired pneumonia)/ multifocal pneumonia  -SLP evaluation ongoing, no overt aspiration however silent aspiration cannot be excluded,  replacement mechanical soft diet and possibly obtain MBS - continue albuterol nebs, flutter valve, O2   -Continue IV Rocephin, Zithromax     HTN (hypertension) -  continue losartan, Coreg 3.125 mg twice daily  Mild acute on chronic diastolic CHF -2D echo showed EF of 60 to 65%, G1 DD, normal right ventricular systolic function -Received Lasix 20 mg IV x 1 on 7/19, will continue for 2 doses -Strict I's and O's and daily weights   13 x 7 mm RUL nodule - incidental finding on CT scan, recommended 3 month follow up with noncon CT or PET-CT    Estimated body mass index is 25.13 kg/m as calculated from the following:   Height as of this encounter: 5\' 4"  (1.626 m).   Weight as of this encounter: 66.4 kg.  Code Status: full  DVT Prophylaxis:  enoxaparin (LOVENOX) injection 40 mg Start: 06/13/23 2200   Level of Care: Level of care: Telemetry Medical Family Communication: Updated patient Disposition Plan:      Remains inpatient appropriate:  w/u in progress, still on 3 L O2 via [place], not on any O2 at baseline  Procedures:  2D echo  Consultants:     Antimicrobials:   Anti-infectives (From admission, onward)    Start     Dose/Rate Route Frequency Ordered Stop   06/15/23 1000  azithromycin (ZITHROMAX) tablet 500 mg        500 mg Oral Daily 06/15/23 0756 06/18/23 0959   06/14/23 1600  azithromycin (ZITHROMAX) 500 mg in sodium chloride 0.9 % 250 mL IVPB  Status:  Discontinued        500 mg 250 mL/hr over 60 Minutes Intravenous Every 24 hours 06/13/23 1458 06/15/23 0756   06/14/23 1000  cefTRIAXone (ROCEPHIN) 2 g in sodium chloride 0.9 % 100 mL IVPB        2 g 200 mL/hr over 30 Minutes Intravenous Every 24 hours 06/13/23 1458 06/19/23 0959   06/13/23 1600  cefTRIAXone (ROCEPHIN) 1 g in sodium chloride 0.9 % 100 mL IVPB        1 g 200 mL/hr over 30 Minutes Intravenous  Once 06/13/23 1554 06/13/23 1804   06/13/23 1445  cefTRIAXone (ROCEPHIN) 1 g in sodium chloride 0.9 % 100 mL IVPB         1 g 200 mL/hr over 30 Minutes Intravenous  Once 06/13/23 1433 06/13/23 1555   06/13/23 1445  azithromycin (ZITHROMAX) 500 mg in sodium chloride 0.9 % 250 mL IVPB        500 mg 250 mL/hr over 60 Minutes Intravenous  Once 06/13/23 1433 06/13/23 1657          Medications  azithromycin  500 mg Oral Daily   brimonidine  1 drop Both Eyes BID   carvedilol  3.125 mg Oral BID WC   enoxaparin (LOVENOX) injection  40 mg Subcutaneous QHS   latanoprost  1 drop Both Eyes QHS   losartan  25 mg Oral Daily   sodium chloride flush  3 mL Intravenous Q12H      Subjective:   Annette Berry was seen and examined today.  States she coughed a lot last night and did not sleep well.  Still on 3 L O2, no fevers or chest pain.  Objective:   Vitals:   06/15/23 0026 06/15/23 0440 06/15/23 0640 06/15/23 0728  BP: (!) 151/64 (!) 155/72  (!) 154/79  Pulse: 86 81  92  Resp:  18  18  Temp: 99.3 F (37.4 C) 98.4 F (36.9 C)  98.6 F (37 C)  TempSrc: Axillary Oral    SpO2: 96% 96%  95%  Weight:   66.4 kg   Height:        Intake/Output Summary (Last 24 hours) at 06/15/2023 1314 Last data filed at 06/15/2023 0908 Gross per 24 hour  Intake 854.22 ml  Output --  Net 854.22 ml     Wt Readings from Last 3 Encounters:  06/15/23 66.4 kg  01/11/21 63.5 kg  06/07/20 59 kg   Physical Exam General: Alert and oriented x 3, NAD, hearing deficit Cardiovascular: S1 S2 clear, RRR.  Respiratory: Diminished breath sounds at the bases, scattered rhonchi Gastrointestinal: Soft, nontender, nondistended, NBS Ext: trace pedal edema bilaterally Neuro: no new deficits Psych: Normal affect    Data Reviewed:  I have personally reviewed following labs    CBC Lab Results  Component Value Date   WBC 17.7 (H) 06/15/2023   RBC 4.66 06/15/2023   HGB 13.7 06/15/2023   HCT 41.1 06/15/2023   MCV 88.2 06/15/2023   MCH 29.4 06/15/2023   PLT 361 06/15/2023   MCHC 33.3  06/15/2023   RDW 13.4  06/15/2023   LYMPHSABS 1.2 01/14/2021   MONOABS 0.5 01/14/2021   EOSABS 0.0 01/14/2021   BASOSABS 0.0 01/14/2021     Last metabolic panel Lab Results  Component Value Date   NA 137 06/15/2023   K 3.4 (L) 06/15/2023   CL 102 06/15/2023   CO2 25 06/15/2023   BUN 17 06/15/2023   CREATININE 0.58 06/15/2023   GLUCOSE 111 (H) 06/15/2023   GFRNONAA >60 06/15/2023   GFRAA >60 09/09/2019   CALCIUM 8.6 (L) 06/15/2023   PHOS 4.2 01/14/2021   PROT 6.2 (L) 06/14/2023   ALBUMIN 2.3 (L) 06/14/2023   BILITOT 0.4 06/14/2023   ALKPHOS 65 06/14/2023   AST 20 06/14/2023   ALT 15 06/14/2023   ANIONGAP 10 06/15/2023    CBG (last 3)  No results for input(s): "GLUCAP" in the last 72 hours.    Coagulation Profile: No results for input(s): "INR", "PROTIME" in the last 168 hours.   Radiology Studies: I have personally reviewed the imaging studies  ECHOCARDIOGRAM COMPLETE  Result Date: 06/14/2023    ECHOCARDIOGRAM REPORT   Patient Name:   Annette Berry Date of Exam: 06/14/2023 Medical Rec #:  [id number]              Height:       64.0 in Accession #:    [id number]             Weight:       152.1 lb Date of Birth:  [birth year]             BSA:          1.742 m Patient Age:    81 years               BP:           118/60 mmHg Patient Gender: F                      HR:           91 bpm. Exam Location:  Inpatient Procedure: 2D Echo, Cardiac Doppler and Color Doppler Indications:    CHF-Acute Diastolic I50.31  History:        Patient has no prior history of Echocardiogram examinations.                 CHF; Risk Factors:Hypertension and Current Smoker.  Sonographer:    Dondra Prader RVT RCS Referring Phys: [id number] Araceli Coufal K Laurian Edrington  Sonographer Comments: Image acquisition challenging due to respiratory motion. IMPRESSIONS  1. Left ventricular ejection fraction, by estimation, is 60 to 65%. The left ventricle has normal function. The left ventricle has no regional wall motion abnormalities. Left ventricular  diastolic parameters are consistent with Grade I diastolic dysfunction (impaired relaxation).  2. Right ventricular systolic function is normal. The right ventricular size is normal.  3. The mitral valve is normal in structure. No evidence of mitral valve regurgitation. No evidence of mitral stenosis.  4. The aortic valve is tricuspid. There is moderate calcification of the aortic valve. Aortic valve regurgitation is not visualized. Aortic valve sclerosis/calcification is present, without any evidence of aortic stenosis.  5. The inferior vena cava is normal in size with greater than 50% respiratory variability, suggesting right atrial pressure of 3 mmHg. FINDINGS  Left Ventricle: Left ventricular ejection fraction, by estimation, is 60 to 65%. The left ventricle has normal function. The left ventricle has no regional wall motion  abnormalities. The left ventricular internal cavity size was normal in size. There is  no left ventricular hypertrophy. Left ventricular diastolic parameters are consistent with Grade I diastolic dysfunction (impaired relaxation). Right Ventricle: The right ventricular size is normal. No increase in right ventricular wall thickness. Right ventricular systolic function is normal. Left Atrium: Left atrial size was normal in size. Right Atrium: Right atrial size was normal in size. Pericardium: There is no evidence of pericardial effusion. Mitral Valve: The mitral valve is normal in structure. No evidence of mitral valve regurgitation. No evidence of mitral valve stenosis. Tricuspid Valve: The tricuspid valve is normal in structure. Tricuspid valve regurgitation is trivial. No evidence of tricuspid stenosis. Aortic Valve: The aortic valve is tricuspid. There is moderate calcification of the aortic valve. Aortic valve regurgitation is not visualized. Aortic valve sclerosis/calcification is present, without any evidence of aortic stenosis. Aortic valve mean gradient measures 6.5 mmHg. Aortic  valve peak gradient measures 12.0 mmHg. Aortic valve area, by VTI measures 1.15 cm. Pulmonic Valve: The pulmonic valve was not well visualized. Pulmonic valve regurgitation is not visualized. No evidence of pulmonic stenosis. Aorta: The aortic root is normal in size and structure and the ascending aorta was not well visualized. Venous: The inferior vena cava is normal in size with greater than 50% respiratory variability, suggesting right atrial pressure of 3 mmHg. IAS/Shunts: No atrial level shunt detected by color flow Doppler.  LEFT VENTRICLE PLAX 2D LVIDd:         4.10 cm   Diastology LVIDs:         2.80 cm   LV e' medial:    8.49 cm/s LV PW:         0.80 cm   LV E/e' medial:  10.4 LV IVS:        0.90 cm   LV e' lateral:   9.25 cm/s LVOT diam:     1.70 cm   LV E/e' lateral: 9.5 LV SV:         35 LV SV Index:   20 LVOT Area:     2.27 cm  RIGHT VENTRICLE             IVC RV Basal diam:  3.80 cm     IVC diam: 1.60 cm RV S prime:     11.70 cm/s TAPSE (M-mode): 1.9 cm LEFT ATRIUM             Index        RIGHT ATRIUM           Index LA Vol (A2C):   28.3 ml 16.25 ml/m  RA Area:     11.30 cm LA Vol (A4C):   24.3 ml 13.92 ml/m  RA Volume:   26.80 ml  15.39 ml/m LA Biplane Vol: 31.1 ml 17.86 ml/m  AORTIC VALVE                     PULMONIC VALVE AV Area (Vmax):    1.08 cm      PV Vmax:       1.43 m/s AV Area (Vmean):   1.10 cm      PV Peak grad:  8.2 mmHg AV Area (VTI):     1.15 cm AV Vmax:           173.00 cm/s AV Vmean:          113.000 cm/s AV VTI:            0.304 m  AV Peak Grad:      12.0 mmHg AV Mean Grad:      6.5 mmHg LVOT Vmax:         82.30 cm/s LVOT Vmean:        55.000 cm/s LVOT VTI:          0.154 m LVOT/AV VTI ratio: 0.51  AORTA Ao Root diam: 3.30 cm MITRAL VALVE MV Area (PHT): 4.21 cm    SHUNTS MV Decel Time: 180 msec    Systemic VTI:  0.15 m MV E velocity: 88.00 cm/s  Systemic Diam: 1.70 cm MV A velocity: 94.30 cm/s MV E/A ratio:  0.93 Arvilla Meres MD Electronically signed by Arvilla Meres  MD Signature Date/Time: 06/14/2023/3:13:32 PM    Final    CT Angio Chest PE W/Cm &/Or Wo Cm  Result Date: 06/13/2023 CLINICAL DATA:  Pulmonary embolism (PE) suspected, low to intermediate prob, positive D-dimer Pulmonary embolism (PE) suspected, high prob. EXAM: CT ANGIOGRAPHY CHEST WITH CONTRAST TECHNIQUE: Multidetector CT imaging of the chest was performed using the standard protocol during bolus administration of intravenous contrast. Multiplanar CT image reconstructions and MIPs were obtained to evaluate the vascular anatomy. RADIATION DOSE REDUCTION: This exam was performed according to the departmental dose-optimization program which includes automated exposure control, adjustment of the mA and/or kV according to patient size and/or use of iterative reconstruction technique. CONTRAST:  75mL OMNIPAQUE IOHEXOL 350 MG/ML SOLN COMPARISON:  Chest radiographs 06/13/2023. FINDINGS: Cardiovascular: Satisfactory opacification of the pulmonary arteries to the segmental level. No evidence of pulmonary embolism. Extensive coronary artery calcifications. Atherosclerotic calcifications of the thoracic aorta. Mediastinum/Nodes: No enlarged mediastinal, hilar, or axillary lymph nodes. Thyroid gland, trachea, and esophagus demonstrate no significant findings. Lungs/Pleura: Moderate centrilobular emphysema. 13 x 7 mm solid nodule in the right upper lobe (axial image 36 series 7). Consolidative opacities in dependent portions of the left-greater-than-right lower lobes, suspicious for aspiration or infection. No pleural effusion or pneumothorax. Upper Abdomen: No acute abnormality. Musculoskeletal: No chest wall abnormality. No acute or significant osseous findings. Review of the MIP images confirms the above findings. IMPRESSION: 1. No evidence of pulmonary embolism. 2. Consolidative opacities in dependent portions of the left-greater-than-right lower lobes, suspicious for aspiration or infection. 3. 13 x 7 mm solid nodule in  the right upper lobe. Per Fleischner Society Guidelines, consider a non-contrast Chest CT at 3 months, a PET/CT, or tissue sampling. These guidelines do not apply to immunocompromised patients and patients with cancer. Follow up in patients with significant comorbidities as clinically warranted. Reference: Radiology. 2017; 284(1):228-43. Aortic Atherosclerosis (ICD10-I70.0) and Emphysema (ICD10-J43.9). Electronically Signed   By: Orvan Falconer M.D.   On: 06/13/2023 14:10       Waverly Tarquinio M.D. [facility] 06/15/2023, 1:14 PM  Available via Epic secure chat 7am-7pm After 7 pm, please refer to night coverage provider listed on amion.

## 2023-06-15 NOTE — Evaluation (Signed)
Physical Therapy Evaluation Patient Details Name: Annette Berry MRN: [id number] DOB: 1941/05/07 Today's Date: 06/15/2023  History of Present Illness  82 y.o. female who presented 06/13/23 with cough and shortness of breath. CXR 7/18: compatible with pneumonia."  Pt with hx CHF, hypertension, hard of hearing.  Clinical Impression   Pt admitted secondary to problem above with deficits below. PTA patient was living alone with intermittent assist of family and friends for some ADLs. She was modified independent with use of her RW.  Pt currently requires min assist for bed mobility and transfers. After standing ~3 minutes for pericare after incontinent episode, she was too fatigued to ambulate and refused transfer to recliner. If patient can again arrange the intermittent assistance she needs, feel she can return home.  Anticipate patient will benefit from PT to address problems listed below.Will continue to follow acutely to maximize functional mobility independence and safety.           Assistance Recommended at Discharge PRN  If plan is discharge home, recommend the following:  Can travel by private vehicle  A little help with walking and/or transfers;Assistance with cooking/housework;Direct supervision/assist for medications management;Direct supervision/assist for financial management;Assist for transportation;Help with stairs or ramp for entrance        Equipment Recommendations None recommended by PT  Recommendations for Other Services       Functional Status Assessment Patient has had a recent decline in their functional status and demonstrates the ability to make significant improvements in function in a reasonable and predictable amount of time.     Precautions / Restrictions Precautions Precautions: Fall Restrictions Weight Bearing Restrictions: No      Mobility  Bed Mobility Overal bed mobility: Needs Assistance Bed Mobility: Supine to Sit, Sit to Sidelying      Supine to sit: Min assist, HOB elevated   Sit to sidelying: Min assist General bed mobility comments: assist to raise torso from supine; assist to raise legs back up to bed    Transfers Overall transfer level: Needs assistance Equipment used: Rolling walker (2 wheels) Transfers: Sit to/from Stand Sit to Stand: Min assist, Min guard           General transfer comment: from EOB x 3 reps progressing from min assist to minguard; refused to sit up in chair    Ambulation/Gait             Pre-gait activities: able to take 4 side-steps with RW and minguard assist toward [facility] General Gait Details: pt refused after standing >3 minutes for pericare (had been incontinent of B/B prior to PT arrival)  Careers information officer     Tilt Bed    Modified Rankin (Stroke Patients Only)       Balance Overall balance assessment: Needs assistance Sitting-balance support: No upper extremity supported, Feet supported Sitting balance-Leahy Scale: Fair     Standing balance support: Bilateral upper extremity supported Standing balance-Leahy Scale: Poor                               Pertinent Vitals/Pain Pain Assessment Pain Assessment: No/denies pain    Home Living Family/patient expects to be discharged to:: Private residence Living Arrangements: Alone Available Help at Discharge: Family;Neighbor;Available PRN/intermittently Type of Home: House Home Access: Ramped entrance       Home Layout: One level Home Equipment: Agricultural consultant (2 wheels);Cane - quad  Prior Function Prior Level of Function : Needs assist             Mobility Comments: reports she mobilizes independently with RW ADLs Comments: assist for ADLs     Hand Dominance   Dominant Hand: Right    Extremity/Trunk Assessment   Upper Extremity Assessment Upper Extremity Assessment: Defer to OT evaluation    Lower Extremity Assessment Lower Extremity  Assessment: Generalized weakness    Cervical / Trunk Assessment Cervical / Trunk Assessment: Kyphotic  Communication   Communication: HOH  Cognition Arousal/Alertness: Awake/alert Behavior During Therapy: WFL for tasks assessed/performed Overall Cognitive Status: No family/caregiver present to determine baseline cognitive functioning                                 General Comments: slow to process vs difficulty hearing PT--required much repetition to complete tasks        General Comments General comments (skin integrity, edema, etc.): Nurse tech in to assist with cleaning pt while seated and then standing. Sats 91% on 3L at rest    Exercises     Assessment/Plan    PT Assessment Patient needs continued PT services  PT Problem List Decreased strength;Decreased activity tolerance;Decreased balance;Decreased mobility;Decreased cognition;Decreased knowledge of use of DME;Cardiopulmonary status limiting activity       PT Treatment Interventions DME instruction;Gait training;Functional mobility training;Therapeutic activities;Therapeutic exercise;Balance training;Cognitive remediation;Patient/family education    PT Goals (Current goals can be found in the Care Plan section)  Acute Rehab PT Goals Patient Stated Goal: go home soon PT Goal Formulation: With patient Time For Goal Achievement: 06/29/23 Potential to Achieve Goals: Good    Frequency Min 3X/week     Co-evaluation               AM-PAC PT "6 Clicks" Mobility  Outcome Measure Help needed turning from your back to your side while in a flat bed without using bedrails?: None Help needed moving from lying on your back to sitting on the side of a flat bed without using bedrails?: A Little Help needed moving to and from a bed to a chair (including a wheelchair)?: A Little Help needed standing up from a chair using your arms (e.g., wheelchair or bedside chair)?: A Little Help needed to walk in hospital  room?: Total Help needed climbing 3-5 steps with a railing? : Total 6 Click Score: 15    End of Session Equipment Utilized During Treatment: Gait belt;Oxygen Activity Tolerance: Patient limited by fatigue Patient left: in bed;with call bell/phone within reach;with bed alarm set Nurse Communication: Mobility status PT Visit Diagnosis: Other abnormalities of gait and mobility (R26.89);Muscle weakness (generalized) (M62.81)    Time: 4782-9562 PT Time Calculation (min) (ACUTE ONLY): 34 min   Charges:   PT Evaluation $PT Eval Low Complexity: 1 Low PT Treatments $Therapeutic Activity: 8-22 mins PT General Charges $$ ACUTE PT VISIT: 1 Visit          Jerolyn Center, PT Acute Rehabilitation Services  Office [phone]   Zena Amos 06/15/2023, 4:22 PM

## 2023-06-16 DIAGNOSIS — E876 Hypokalemia: Secondary | ICD-10-CM | POA: Diagnosis not present

## 2023-06-16 DIAGNOSIS — J189 Pneumonia, unspecified organism: Secondary | ICD-10-CM | POA: Diagnosis not present

## 2023-06-16 DIAGNOSIS — J9601 Acute respiratory failure with hypoxia: Secondary | ICD-10-CM | POA: Diagnosis not present

## 2023-06-16 LAB — BASIC METABOLIC PANEL
Anion gap: 11 (ref 5–15)
BUN: 13 mg/dL (ref 8–23)
CO2: 25 mmol/L (ref 22–32)
Calcium: 8.9 mg/dL (ref 8.9–10.3)
Chloride: 101 mmol/L (ref 98–111)
Creatinine, Ser: 0.63 mg/dL (ref 0.44–1.00)
GFR, Estimated: >60 mL/min (ref >60)
Glucose, Bld: 100 mg/dL — ABNORMAL HIGH (ref 70–99)
Potassium: 3.9 mmol/L (ref 3.5–5.1)
Sodium: 137 mmol/L (ref 135–145)

## 2023-06-16 LAB — CBC
HCT: 43.1 % (ref 36.0–46.0)
Hemoglobin: 13.9 g/dL (ref 12.0–15.0)
MCH: 28.3 pg (ref 26.0–34.0)
MCHC: 32.3 g/dL (ref 30.0–36.0)
MCV: 87.8 fL (ref 80.0–100.0)
Platelets: 396 10*3/uL (ref 150–400)
RBC: 4.91 MIL/uL (ref 3.87–5.11)
RDW: 13.1 % (ref 11.5–15.5)
WBC: 14.9 10*3/uL — ABNORMAL HIGH (ref 4.0–10.5)
nRBC: 0 % (ref 0.0–0.2)

## 2023-06-16 MED ORDER — CARVEDILOL 6.25 MG PO TABS
6.2500 mg | ORAL_TABLET | Freq: Two times a day (BID) | ORAL | Status: DC
  Administered 2023-06-16 – 2023-06-20 (×8): 6.25 mg via ORAL
  Filled 2023-06-16 (×8): qty 1

## 2023-06-16 MED ORDER — FUROSEMIDE 20 MG PO TABS
20.0000 mg | ORAL_TABLET | Freq: Every day | ORAL | Status: DC
  Administered 2023-06-17 – 2023-06-20 (×4): 20 mg via ORAL
  Filled 2023-06-16 (×4): qty 1

## 2023-06-16 NOTE — Plan of Care (Signed)

## 2023-06-16 NOTE — Progress Notes (Signed)
Physical Therapy Treatment Patient Details Name: Annette Berry MRN: [id number] DOB: 1940/12/31 Today's Date: 06/16/2023   History of Present Illness 82 y.o. female who presented 06/13/23 with cough and shortness of breath. CXR 7/18: compatible with pneumonia."  Pt with hx CHF, hypertension, hard of hearing.    PT Comments  Pt received in bed, agreeable to participation in therapy. She required min assist bed mobility and min assist sit to stand with RW. After 2 standing trials bedside, pt declining further mobility stating "it's Sunday and I'm just going to rest." SpO2 90% on 2L. Pt very vocal about her desire to return home and unwillingness to entertain SNF at d/c. Pt returned to supine in bed at end of session.     Assistance Recommended at Discharge PRN  If plan is discharge home, recommend the following:  Can travel by private vehicle    A little help with walking and/or transfers;Assistance with cooking/housework;Direct supervision/assist for medications management;Direct supervision/assist for financial management;Assist for transportation;Help with stairs or ramp for entrance;A little help with bathing/dressing/bathroom      Equipment Recommendations  None recommended by PT    Recommendations for Other Services       Precautions / Restrictions Precautions Precautions: Fall Restrictions Weight Bearing Restrictions: No     Mobility  Bed Mobility Overal bed mobility: Needs Assistance Bed Mobility: Supine to Sit, Sit to Supine     Supine to sit: Min assist, HOB elevated Sit to supine: Min assist   General bed mobility comments: increased time, assist to elevate trunk, assist with BLE back to bed    Transfers Overall transfer level: Needs assistance Equipment used: Rolling walker (2 wheels) Transfers: Sit to/from Stand Sit to Stand: Min assist           General transfer comment: STS x 2 trials with RW. Assist to power up and stabilize balance. Pt  declining amb attempt or OOB to recliner.    Ambulation/Gait                   Stairs             Wheelchair Mobility     Tilt Bed    Modified Rankin (Stroke Patients Only)       Balance Overall balance assessment: Needs assistance Sitting-balance support: No upper extremity supported, Feet supported Sitting balance-Leahy Scale: Fair     Standing balance support: Bilateral upper extremity supported, Reliant on assistive device for balance Standing balance-Leahy Scale: Poor                              Cognition Arousal/Alertness: Awake/alert Behavior During Therapy: WFL for tasks assessed/performed Overall Cognitive Status: No family/caregiver present to determine baseline cognitive functioning                                 General Comments: Poor insight into current situation. Unable to ambulate but insisting on returning home. Lives alone.        Exercises      General Comments General comments (skin integrity, edema, etc.): Pt on RA on entry due to pt removing [place]. SpO2 85%. 2L O2 replaced with improvement to 90%.      Pertinent Vitals/Pain Pain Assessment Pain Assessment: No/denies pain    Home Living  Prior Function            PT Goals (current goals can now be found in the care plan section) Acute Rehab PT Goals Patient Stated Goal: home    Frequency    Min 3X/week      PT Plan Current plan remains appropriate    Co-evaluation              AM-PAC PT "6 Clicks" Mobility   Outcome Measure  Help needed turning from your back to your side while in a flat bed without using bedrails?: None Help needed moving from lying on your back to sitting on the side of a flat bed without using bedrails?: A Little Help needed moving to and from a bed to a chair (including a wheelchair)?: A Little Help needed standing up from a chair using your arms (e.g., wheelchair or  bedside chair)?: A Little Help needed to walk in hospital room?: Total Help needed climbing 3-5 steps with a railing? : Total 6 Click Score: 15    End of Session Equipment Utilized During Treatment: Gait belt;Oxygen Activity Tolerance: Patient limited by fatigue Patient left: in bed;with call bell/phone within reach;with bed alarm set Nurse Communication: Mobility status PT Visit Diagnosis: Other abnormalities of gait and mobility (R26.89);Muscle weakness (generalized) (M62.81)     Time: 1610-9604 PT Time Calculation (min) (ACUTE ONLY): 25 min  Charges:    $Therapeutic Activity: 23-37 mins PT General Charges $$ ACUTE PT VISIT: 1 Visit                     Ferd Glassing., PT  Office # [phone]    Ilda Foil 06/16/2023, 9:42 AM

## 2023-06-16 NOTE — Progress Notes (Addendum)
[facility]                                                                              Annette Berry, is a 82 y.o. female, DOB - 14-Jun-1941, ZOX:[id number] Admit date - 06/13/2023    Outpatient Primary MD for the patient is Kaleen Mask, MD  LOS - 2  days  Chief Complaint  Patient presents with   Cough       Brief summary   Patient is a  82 y.o. female with CHF, hypertension, hard of hearing presented with cough and shortness of breath.  Patient reported she has been experiencing some cough with associated shortness of breath for the past week.  Denies any known sick contacts. States she saw her PCP on Tuesday and as far as he knows he referred her to see someone else but does not remember if she received any new prescriptions.   Reports possible subjective fevers but has not taken her temperature. Denies fever, chills, chest pain, abdominal pain, constipation, diarrhea, nausea, vomiting. Chest x-ray showed persistent asymmetric airspace disease at the left base. CTA PE study was negative for PE but did show consolidation of the left greater than right base suspicious for aspiration versus infection. Also noted was a 13 x 7 mm nodule with recommendation for follow-up. Patient received steroids, Atrovent, albuterol, ceftriaxone, azithromycin, K in the ED.    Assessment & Plan    Principal Problem:   Acute respiratory failure with hypoxia (HCC) - likely due to multifocal pneumonia, may have underlying undiagnosed COPD, ?CHF  - met SIRS criteria with tachycardia, mild tachypnea, lactic acidosis, hypoxia, source likey PNA   - BNP 265.4, 2D echo showed EF of 60 to 65%, G1 DD normal right ventricular systolic function. -O2 sats 91% on 3 L, wean O2 as tolerated, not on O2 at baseline -Home O2 evaluation prior to discharge. -Continue IV Lasix today, will reassess in a.m.  Active Problems:   CAP (community acquired pneumonia)/ multifocal pneumonia   -SLP eval showed no overt aspiration however silent aspiration cannot be excluded, replacement mechanical soft diet and possibly obtain MBS - continue albuterol nebs, flutter valve, O2   -Continue IV Rocephin, Zithromax     HTN (hypertension) -  continue losartan -Increase Coreg to 6.25 mg twice daily  Mild acute on chronic diastolic CHF -2D echo showed EF of 60 to 65%, G1 DD, normal right ventricular systolic function -Received Lasix 20 mg IV daily x 3 doses, transition to oral Lasix 20 mg daily -Strict I's and O's and daily weights   13 x 7 mm RUL nodule - incidental finding on CT scan, recommended 3 month follow up with noncon CT or PET-CT    Estimated body mass index is 25.01 kg/m as calculated from the following:   Height as of this encounter: 5\' 4"  (1.626 m).   Weight as of this encounter: 66.1 kg.  Code Status: full  DVT Prophylaxis:  enoxaparin (LOVENOX) injection 40 mg Start: 06/13/23 2200   Level of Care: Level of care: Telemetry Medical Family Communication: Updated patient Disposition Plan:      Remains inpatient appropriate:  w/u in progress, still on 3 L O2 via [place], not on any O2 at baseline   Procedures:  2D echo  Consultants:     Antimicrobials:   Anti-infectives (From admission, onward)    Start     Dose/Rate Route Frequency Ordered Stop   06/15/23 1000  azithromycin (ZITHROMAX) tablet 500 mg        500 mg Oral Daily 06/15/23 0756 06/18/23 0959   06/14/23 1600  azithromycin (ZITHROMAX) 500 mg in sodium chloride 0.9 % 250 mL IVPB  Status:  Discontinued        500 mg 250 mL/hr over 60 Minutes Intravenous Every 24 hours 06/13/23 1458 06/15/23 0756   06/14/23 1000  cefTRIAXone (ROCEPHIN) 2 g in sodium chloride 0.9 % 100 mL IVPB        2 g 200 mL/hr over 30 Minutes Intravenous Every 24 hours 06/13/23 1458 06/19/23 0959   06/13/23 1600  cefTRIAXone (ROCEPHIN) 1 g in sodium chloride 0.9 % 100 mL IVPB        1 g 200 mL/hr over 30 Minutes Intravenous   Once 06/13/23 1554 06/13/23 1804   06/13/23 1445  cefTRIAXone (ROCEPHIN) 1 g in sodium chloride 0.9 % 100 mL IVPB        1 g 200 mL/hr over 30 Minutes Intravenous  Once 06/13/23 1433 06/13/23 1555   06/13/23 1445  azithromycin (ZITHROMAX) 500 mg in sodium chloride 0.9 % 250 mL IVPB        500 mg 250 mL/hr over 60 Minutes Intravenous  Once 06/13/23 1433 06/13/23 1657          Medications  azithromycin  500 mg Oral Daily   benzonatate  200 mg Oral TID   brimonidine  1 drop Both Eyes BID   carvedilol  3.125 mg Oral BID WC   enoxaparin (LOVENOX) injection  40 mg Subcutaneous QHS   latanoprost  1 drop Both Eyes QHS   losartan  25 mg Oral Daily   melatonin  3 mg Oral QHS   sodium chloride flush  3 mL Intravenous Q12H      Subjective:   Annette Berry was seen and examined today.  Still coughing, no chest pain or acute shortness of breath, no fevers, feeling somewhat better today.  Still on 3 L O2.   Objective:   Vitals:   06/16/23 0409 06/16/23 0610 06/16/23 0718 06/16/23 1208  BP: (!) 161/94  (!) 145/90 (!) 157/64  Pulse: 88  92 82  Resp: 18  18 18   Temp: 98 F (36.7 C)  97.6 F (36.4 C) 98 F (36.7 C)  TempSrc: Oral     SpO2: (!) 86%  91% 91%  Weight:  66.1 kg    Height:        Intake/Output Summary (Last 24 hours) at 06/16/2023 1225 Last data filed at 06/16/2023 0917 Gross per 24 hour  Intake 170 ml  Output --  Net 170 ml     Wt Readings from Last 3 Encounters:  06/16/23 66.1 kg  01/11/21 63.5 kg  06/07/20 59 kg    Physical Exam General: Alert and oriented x 3, NAD Cardiovascular: S1 S2 clear, RRR.  Respiratory: Diminished breath sounds at the bases Gastrointestinal: Soft, nontender, nondistended, NBS Ext: no pedal edema bilaterally Neuro: no new deficits Psych: Normal affect   Data Reviewed:  I have personally reviewed following labs    CBC Lab Results  Component Value Date   WBC 14.9 (H) 06/16/2023   RBC  4.91 06/16/2023   HGB 13.9  06/16/2023   HCT 43.1 06/16/2023   MCV 87.8 06/16/2023   MCH 28.3 06/16/2023   PLT 396 06/16/2023   MCHC 32.3 06/16/2023   RDW 13.1 06/16/2023   LYMPHSABS 1.2 01/14/2021   MONOABS 0.5 01/14/2021   EOSABS 0.0 01/14/2021   BASOSABS 0.0 01/14/2021     Last metabolic panel Lab Results  Component Value Date   NA 137 06/16/2023   K 3.9 06/16/2023   CL 101 06/16/2023   CO2 25 06/16/2023   BUN 13 06/16/2023   CREATININE 0.63 06/16/2023   GLUCOSE 100 (H) 06/16/2023   GFRNONAA >60 06/16/2023   GFRAA >60 09/09/2019   CALCIUM 8.9 06/16/2023   PHOS 4.2 01/14/2021   PROT 6.2 (L) 06/14/2023   ALBUMIN 2.3 (L) 06/14/2023   BILITOT 0.4 06/14/2023   ALKPHOS 65 06/14/2023   AST 20 06/14/2023   ALT 15 06/14/2023   ANIONGAP 11 06/16/2023    CBG (last 3)  No results for input(s): "GLUCAP" in the last 72 hours.    Coagulation Profile: No results for input(s): "INR", "PROTIME" in the last 168 hours.   Radiology Studies: I have personally reviewed the imaging studies  ECHOCARDIOGRAM COMPLETE  Result Date: 06/14/2023    ECHOCARDIOGRAM REPORT   Patient Name:   Annette Berry Date of Exam: 06/14/2023 Medical Rec #:  [id number]              Height:       64.0 in Accession #:    [id number]             Weight:       152.1 lb Date of Birth:  06-16-1941             BSA:          1.742 m Patient Age:    81 years               BP:           118/60 mmHg Patient Gender: F                      HR:           91 bpm. Exam Location:  Inpatient Procedure: 2D Echo, Cardiac Doppler and Color Doppler Indications:    CHF-Acute Diastolic I50.31  History:        Patient has no prior history of Echocardiogram examinations.                 CHF; Risk Factors:Hypertension and Current Smoker.  Sonographer:    Dondra Prader RVT RCS Referring Phys: [id number] Halen Mossbarger K Rolly Magri  Sonographer Comments: Image acquisition challenging due to respiratory motion. IMPRESSIONS  1. Left ventricular ejection fraction, by estimation, is 60  to 65%. The left ventricle has normal function. The left ventricle has no regional wall motion abnormalities. Left ventricular diastolic parameters are consistent with Grade I diastolic dysfunction (impaired relaxation).  2. Right ventricular systolic function is normal. The right ventricular size is normal.  3. The mitral valve is normal in structure. No evidence of mitral valve regurgitation. No evidence of mitral stenosis.  4. The aortic valve is tricuspid. There is moderate calcification of the aortic valve. Aortic valve regurgitation is not visualized. Aortic valve sclerosis/calcification is present, without any evidence of aortic stenosis.  5. The inferior vena cava is normal in size with greater than 50% respiratory variability, suggesting right atrial pressure of 3  mmHg. FINDINGS  Left Ventricle: Left ventricular ejection fraction, by estimation, is 60 to 65%. The left ventricle has normal function. The left ventricle has no regional wall motion abnormalities. The left ventricular internal cavity size was normal in size. There is  no left ventricular hypertrophy. Left ventricular diastolic parameters are consistent with Grade I diastolic dysfunction (impaired relaxation). Right Ventricle: The right ventricular size is normal. No increase in right ventricular wall thickness. Right ventricular systolic function is normal. Left Atrium: Left atrial size was normal in size. Right Atrium: Right atrial size was normal in size. Pericardium: There is no evidence of pericardial effusion. Mitral Valve: The mitral valve is normal in structure. No evidence of mitral valve regurgitation. No evidence of mitral valve stenosis. Tricuspid Valve: The tricuspid valve is normal in structure. Tricuspid valve regurgitation is trivial. No evidence of tricuspid stenosis. Aortic Valve: The aortic valve is tricuspid. There is moderate calcification of the aortic valve. Aortic valve regurgitation is not visualized. Aortic valve  sclerosis/calcification is present, without any evidence of aortic stenosis. Aortic valve mean gradient measures 6.5 mmHg. Aortic valve peak gradient measures 12.0 mmHg. Aortic valve area, by VTI measures 1.15 cm. Pulmonic Valve: The pulmonic valve was not well visualized. Pulmonic valve regurgitation is not visualized. No evidence of pulmonic stenosis. Aorta: The aortic root is normal in size and structure and the ascending aorta was not well visualized. Venous: The inferior vena cava is normal in size with greater than 50% respiratory variability, suggesting right atrial pressure of 3 mmHg. IAS/Shunts: No atrial level shunt detected by color flow Doppler.  LEFT VENTRICLE PLAX 2D LVIDd:         4.10 cm   Diastology LVIDs:         2.80 cm   LV e' medial:    8.49 cm/s LV PW:         0.80 cm   LV E/e' medial:  10.4 LV IVS:        0.90 cm   LV e' lateral:   9.25 cm/s LVOT diam:     1.70 cm   LV E/e' lateral: 9.5 LV SV:         35 LV SV Index:   20 LVOT Area:     2.27 cm  RIGHT VENTRICLE             IVC RV Basal diam:  3.80 cm     IVC diam: 1.60 cm RV S prime:     11.70 cm/s TAPSE (M-mode): 1.9 cm LEFT ATRIUM             Index        RIGHT ATRIUM           Index LA Vol (A2C):   28.3 ml 16.25 ml/m  RA Area:     11.30 cm LA Vol (A4C):   24.3 ml 13.92 ml/m  RA Volume:   26.80 ml  15.39 ml/m LA Biplane Vol: 31.1 ml 17.86 ml/m  AORTIC VALVE                     PULMONIC VALVE AV Area (Vmax):    1.08 cm      PV Vmax:       1.43 m/s AV Area (Vmean):   1.10 cm      PV Peak grad:  8.2 mmHg AV Area (VTI):     1.15 cm AV Vmax:           173.00  cm/s AV Vmean:          113.000 cm/s AV VTI:            0.304 m AV Peak Grad:      12.0 mmHg AV Mean Grad:      6.5 mmHg LVOT Vmax:         82.30 cm/s LVOT Vmean:        55.000 cm/s LVOT VTI:          0.154 m LVOT/AV VTI ratio: 0.51  AORTA Ao Root diam: 3.30 cm MITRAL VALVE MV Area (PHT): 4.21 cm    SHUNTS MV Decel Time: 180 msec    Systemic VTI:  0.15 m MV E velocity: 88.00 cm/s   Systemic Diam: 1.70 cm MV A velocity: 94.30 cm/s MV E/A ratio:  0.93 Arvilla Meres MD Electronically signed by Arvilla Meres MD Signature Date/Time: 06/14/2023/3:13:32 PM    Final        Thad Ranger M.D. [facility] 06/16/2023, 12:25 PM  Available via Epic secure chat 7am-7pm After 7 pm, please refer to night coverage provider listed on amion.

## 2023-06-16 NOTE — Evaluation (Signed)
Occupational Therapy Evaluation Patient Details Name: Annette Berry MRN: [id number] DOB: 03-31-1941 Today's Date: 06/16/2023   History of Present Illness 82 y.o. female who presented 06/13/23 with cough and shortness of breath. CXR 7/18: compatible with pneumonia."  Pt with hx CHF, hypertension, hard of hearing.   Clinical Impression   PTA, pt lived alone with frequent assist from neighbor, son, and other family members. Upon eva, pt confused, unaware of month, day, or location. Pt performing bed mobility with min A and STS transfers with min A. Doffing socks sitting EOB with min guard A. Pt following one step commands, but refusing functional ambulation within room or transfer to chair. Pt with fair memory, recalling where therapist is from several minutes after being told. Called pt son in regard to pt likely needing more assist than her baseline as well as additional confusion. Pt refusing inpatient rehab; Son confirms pt with good support and is agreeable to [facility] per pt wishes.      Recommendations for follow up therapy are one component of a multi-disciplinary discharge planning process, led by the attending physician.  Recommendations may be updated based on patient status, additional functional criteria and insurance authorization.   Assistance Recommended at Discharge Frequent or constant Supervision/Assistance  Patient can return home with the following A little help with walking and/or transfers;A little help with bathing/dressing/bathroom;Assistance with cooking/housework;Direct supervision/assist for financial management;Direct supervision/assist for medications management;Assist for transportation;Help with stairs or ramp for entrance    Functional Status Assessment  Patient has had a recent decline in their functional status and demonstrates the ability to make significant improvements in function in a reasonable and predictable amount of time.  Equipment Recommendations   BSC/3in1    Recommendations for Other Services       Precautions / Restrictions Precautions Precautions: Fall Restrictions Weight Bearing Restrictions: No      Mobility Bed Mobility Overal bed mobility: Needs Assistance Bed Mobility: Supine to Sit, Sit to Supine     Supine to sit: Min assist, HOB elevated Sit to supine: Min assist   General bed mobility comments: increased time, assist to elevate trunk, assist with BLE back to bed    Transfers Overall transfer level: Needs assistance Equipment used: Rolling walker (2 wheels) Transfers: Sit to/from Stand Sit to Stand: Min assist           General transfer comment: STS x 2 trials with RW. Assist to power up and stabilize balance. Pt declining amb attempt or OOB to recliner.      Balance Overall balance assessment: Needs assistance Sitting-balance support: No upper extremity supported, Feet supported Sitting balance-Leahy Scale: Fair     Standing balance support: Bilateral upper extremity supported, Reliant on assistive device for balance Standing balance-Leahy Scale: Poor                             ADL either performed or assessed with clinical judgement   ADL Overall ADL's : Needs assistance/impaired Eating/Feeding: Modified independent;Sitting   Grooming: Sitting;Set up   Upper Body Bathing: Set up;Sitting   Lower Body Bathing: Minimal assistance;Sit to/from stand   Upper Body Dressing : Set up;Sitting   Lower Body Dressing: Minimal assistance;Sit to/from stand   Toilet Transfer: Minimal assistance;Rolling walker (2 wheels) Toilet Transfer Details (indicate cue type and reason): Pt refusing to take steps this session         Functional mobility during ADLs: Minimal assistance;Rolling walker (2 wheels) (STS  only this session; pt refusing ambulation)       Vision         Perception     Praxis      Pertinent Vitals/Pain Pain Assessment Pain Assessment: No/denies pain      Hand Dominance Right   Extremity/Trunk Assessment Upper Extremity Assessment Upper Extremity Assessment: Generalized weakness   Lower Extremity Assessment Lower Extremity Assessment: Generalized weakness   Cervical / Trunk Assessment Cervical / Trunk Assessment: Kyphotic   Communication Communication Communication: HOH   Cognition Arousal/Alertness: Awake/alert Behavior During Therapy: WFL for tasks assessed/performed                                         General Comments  2L [place] maintaining 90% SpO2 on arrival and up to 92 after transfers    Exercises     Shoulder Instructions      Home Living Family/patient expects to be discharged to:: Private residence Living Arrangements: Alone Available Help at Discharge: Family;Neighbor;Available PRN/intermittently Type of Home: House Home Access: Ramped entrance     Home Layout: One level     Bathroom Shower/Tub: Producer, television/film/video: Standard Bathroom Accessibility: Yes   Home Equipment: Agricultural consultant (2 wheels);Cane - quad          Prior Functioning/Environment Prior Level of Function : Needs assist             Mobility Comments: reports she mobilizes independently with RW ADLs Comments: assist for ADLs from neighbor        OT Problem List: Decreased strength;Impaired balance (sitting and/or standing);Decreased activity tolerance;Decreased cognition;Cardiopulmonary status limiting activity;Decreased knowledge of precautions;Decreased knowledge of use of DME or AE;Decreased safety awareness      OT Treatment/Interventions: Self-care/ADL training;Therapeutic exercise;DME and/or AE instruction;Patient/family education;Balance training;Therapeutic activities;Cognitive remediation/compensation    OT Goals(Current goals can be found in the care plan section) Acute Rehab OT Goals Patient Stated Goal: get better OT Goal Formulation: With patient Time For Goal Achievement:  06/30/23 Potential to Achieve Goals: Good  OT Frequency: Min 2X/week    Co-evaluation              AM-PAC OT "6 Clicks" Daily Activity     Outcome Measure Help from another person eating meals?: None Help from another person taking care of personal grooming?: A Little Help from another person toileting, which includes using toliet, bedpan, or urinal?: A Lot Help from another person bathing (including washing, rinsing, drying)?: A Little Help from another person to put on and taking off regular upper body clothing?: A Little Help from another person to put on and taking off regular lower body clothing?: A Lot 6 Click Score: 17   End of Session Equipment Utilized During Treatment: Gait belt;Rolling walker (2 wheels) Nurse Communication: Mobility status  Activity Tolerance: Patient tolerated treatment well Patient left: in bed;with call bell/phone within reach;with bed alarm set  OT Visit Diagnosis: Unsteadiness on feet (R26.81);Muscle weakness (generalized) (M62.81);Other symptoms and signs involving cognitive function                Time: 1438-1500 OT Time Calculation (min): 22 min Charges:  OT General Charges $OT Visit: 1 Visit OT Evaluation $OT Eval Moderate Complexity: 1 Mod  Tyler Deis, OTR/L [facility] Acute Rehabilitation Office: [phone]   Myrla Halsted 06/16/2023, 3:26 PM

## 2023-06-17 ENCOUNTER — Inpatient Hospital Stay (HOSPITAL_COMMUNITY): Payer: Medicare PPO

## 2023-06-17 DIAGNOSIS — J189 Pneumonia, unspecified organism: Secondary | ICD-10-CM | POA: Diagnosis not present

## 2023-06-17 DIAGNOSIS — E876 Hypokalemia: Secondary | ICD-10-CM | POA: Diagnosis not present

## 2023-06-17 DIAGNOSIS — J9601 Acute respiratory failure with hypoxia: Secondary | ICD-10-CM | POA: Diagnosis not present

## 2023-06-17 LAB — BASIC METABOLIC PANEL
Anion gap: 7 (ref 5–15)
BUN: 15 mg/dL (ref 8–23)
CO2: 28 mmol/L (ref 22–32)
Calcium: 8.8 mg/dL — ABNORMAL LOW (ref 8.9–10.3)
Chloride: 102 mmol/L (ref 98–111)
Creatinine, Ser: 0.72 mg/dL (ref 0.44–1.00)
GFR, Estimated: >60 mL/min (ref >60)
Glucose, Bld: 109 mg/dL — ABNORMAL HIGH (ref 70–99)
Potassium: 3.7 mmol/L (ref 3.5–5.1)
Sodium: 137 mmol/L (ref 135–145)

## 2023-06-17 LAB — CBC
HCT: 43.4 % (ref 36.0–46.0)
Hemoglobin: 14.2 g/dL (ref 12.0–15.0)
MCH: 28.8 pg (ref 26.0–34.0)
MCHC: 32.7 g/dL (ref 30.0–36.0)
MCV: 88 fL (ref 80.0–100.0)
Platelets: 414 10*3/uL — ABNORMAL HIGH (ref 150–400)
RBC: 4.93 MIL/uL (ref 3.87–5.11)
RDW: 13 % (ref 11.5–15.5)
WBC: 14 10*3/uL — ABNORMAL HIGH (ref 4.0–10.5)
nRBC: 0 % (ref 0.0–0.2)

## 2023-06-17 LAB — GLUCOSE, CAPILLARY
Glucose-Capillary: 160 mg/dL — ABNORMAL HIGH (ref 70–99)
Glucose-Capillary: 128 mg/dL — ABNORMAL HIGH (ref 70–99)

## 2023-06-17 LAB — CULTURE, BLOOD (ROUTINE X 2): Culture: NO GROWTH

## 2023-06-17 NOTE — Progress Notes (Signed)
PT Cancellation Note  Patient Details Name: Annette Berry MRN: [id number] DOB: December 11, 1940   Cancelled Treatment:    Reason Eval/Treat Not Completed: Patient at procedure or test/unavailable. Pt off floor for MBS. PT to re-attempt as time allows.   Ilda Foil 06/17/2023, 8:52 AM

## 2023-06-17 NOTE — Care Management Important Message (Signed)
Important Message  Patient Details  Name: CHARELLE PETRAKIS MRN: [id number] Date of Birth: [birth year]   Medicare Important Message Given:  Yes     Dorena Bodo 06/17/2023, 3:22 PM

## 2023-06-17 NOTE — Procedures (Signed)
Modified Barium Swallow Study  Patient Details  Name: Annette Berry MRN: [id number] Date of Birth: Dec 07, 1940  Today's Date: 06/17/2023  Modified Barium Swallow completed.  Full report located under Chart Review in the Imaging Section.  History of Present Illness Annette Berry is an 82 y.o. female who presented with cough and shortness of breath.   Patient reported she has been experiencing some cough with associated shortness of breath for the past week.  Denies any known sick contacts. CXR 7/18: "Persistent asymmetric airspace disease within the left base compatible with pneumonia." Chest CT 7/18: "Consolidative opacities in dependent portions of the left-greater-than-right lower lobes, suspicious for aspiration or infection."  Pt with hx CHF, hypertension, hard of hearing.   Clinical Impression Pt presents with functional oropharyngeal swallow with delayed swallow initiation at the vallecula with solids and pyriforms with liquids.  There was trace, transient penetration of thin liquid before the swallow, above the level of the vocal folds during pill simulation, which fully cleared during swallow.  There was no penetration of thin liquids in isolation or of any other consistencies trialed today.  Pt noted to talk prior to swallow with full bolus of liquid in pharynx, pooled in the vallecula and pyriform sinuses without any penetration.  During pill simulation there was esopheal retention of tablet and mild backflow with in the esophagus.  There was progression of tablet with liquid wash, and full clearance with puree bolus.  Consider giving meds whole with puree, or at least following medication administration with bites of puree. Pt has no further ST needs. SLP will sign off. Recommend continuing current mechanical soft diet with thin liquids, where pt does not have dentures present this admission, but pt is safe to advance to a regular texture diet if desired. Factors that may  increase risk of adverse event in presence of aspiration Rubye Oaks & Clearance Coots 2021): Respiratory or GI disease  Swallow Evaluation Recommendations Recommendations: PO diet PO Diet Recommendation: Dysphagia 3 (Mechanical soft);Thin liquids (Level 0) (may advance to regular if desired) Liquid Administration via: Cup;Straw Medication Administration: Whole meds with puree (or whole with liquid giving bites of puree to follow) Swallowing strategies  : Slow rate;Small bites/sips Postural changes: Position pt fully upright for meals;Stay upright 30-60 min after meals Oral care recommendations: Oral care BID (2x/day)      Kerrie Pleasure, MA, CCC-SLP Acute Rehabilitation Services Office: [phone] 06/17/2023,10:28 AM

## 2023-06-17 NOTE — Plan of Care (Signed)

## 2023-06-17 NOTE — TOC Progression Note (Signed)
Transition of Care [facility]) - Progression Note    Patient Details  Name: Annette Berry MRN: [id number] Date of Birth: [birth year]  Transition of [facility]) CM/SW Contact  Janae Bridgeman, RN Phone Number: 06/17/2023, 3:55 PM  Clinical Narrative:    CM met with the patient at the bedside and patient hopes to go home tomorrow.  I called and spoke with the patient's son by phone and the patient lives alone and has a neighbor at the home to assist around 5 hours/day.  The patient is pending oxygen ambulation test at this time to determine oxygen needs for home.  Patient is currently on 2 L/min [place].  She quit smoking years ago and son confirmed.  The patient will also need 3:1.  I provided Medicare choice for home health services with the son and the son did not have a preference.  I called [facility] and they accepted the patient for home health services for RM, PT, OT, aide.  Home health orders were placed to be co-signed by the attending MD.  CM will continue to follow the patient for TOC needs - pending oxygen needs for home and 3:1.     Expected Discharge Plan: Home w Home Health Services Barriers to Discharge: Continued Medical Work up  Expected Discharge Plan and Services   Discharge Planning Services: CM Consult Post Acute Care Choice: Durable Medical Equipment Living arrangements for the past 2 months: Single Family Home                                       Social Determinants of Health (SDOH) Interventions SDOH Screenings   Food Insecurity: No Food Insecurity (06/13/2023)  Housing: Low Risk  (06/13/2023)  Transportation Needs: No Transportation Needs (06/13/2023)  Utilities: Not At Risk (06/13/2023)  Tobacco Use: High Risk (06/13/2023)    Readmission Risk Interventions     No data to display

## 2023-06-17 NOTE — Progress Notes (Signed)
SATURATION QUALIFICATIONS: (This note is used to comply with regulatory documentation for home oxygen)  Patient Saturations on Room Air at Rest = 88%  Patient Saturations on Room Air while Ambulating = 0% patient does not walk.   Patient Saturations on 0 Liters of oxygen while Ambulating = 0%. Patient does not walk.   Please briefly explain why patient needs home oxygen:   Patient does not ambulate. Patient still need 2 lit oxygen white at rest.

## 2023-06-17 NOTE — Progress Notes (Signed)
[facility]                                                                              Chosen Garron, is a 82 y.o. female, DOB - July 16, 1941, UYQ:[id number] Admit date - 06/13/2023    Outpatient Primary MD for the patient is Kaleen Mask, MD  LOS - 3  days  Chief Complaint  Patient presents with   Cough       Brief summary   Patient is a  82 y.o. female with CHF, hypertension, hard of hearing presented with cough and shortness of breath.  Patient reported she has been experiencing some cough with associated shortness of breath for the past week.  Denies any known sick contacts. States she saw her PCP on Tuesday and as far as he knows he referred her to see someone else but does not remember if she received any new prescriptions.   Reports possible subjective fevers but has not taken her temperature. Denies fever, chills, chest pain, abdominal pain, constipation, diarrhea, nausea, vomiting. Chest x-ray showed persistent asymmetric airspace disease at the left base. CTA PE study was negative for PE but did show consolidation of the left greater than right base suspicious for aspiration versus infection. Also noted was a 13 x 7 mm nodule with recommendation for follow-up. Patient received steroids, Atrovent, albuterol, ceftriaxone, azithromycin, K in the ED.    Assessment & Plan    Principal Problem:   Acute respiratory failure with hypoxia (HCC) - likely due to multifocal pneumonia, may have underlying undiagnosed COPD, ?CHF  - met SIRS criteria with tachycardia, mild tachypnea, lactic acidosis, hypoxia, source likey PNA   - BNP 265.4, 2D echo showed EF of 60 to 65%, G1 DD normal right ventricular systolic function. 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   Weight as of this encounter: 66.1 kg.  Code Status: full  DVT Prophylaxis:  enoxaparin (LOVENOX) injection 40 mg Start: 06/13/23 2200   Level of Care: Level of care: Telemetry Medical Family Communication: Updated patient's son, Mosetta Pigeon on the phone Disposition Plan:      Remains inpatient appropriate: Home O2 evaluation tomorrow, likely will need 2 L O2.  Plan for  DC home tomorrow.   Procedures:  2D echo  Consultants:     Antimicrobials:   Anti-infectives (From admission, onward)    Start     Dose/Rate Route Frequency Ordered Stop   06/15/23 1000  azithromycin (ZITHROMAX) tablet 500 mg        500 mg Oral Daily 06/15/23 0756 06/17/23 0953   06/14/23 1600  azithromycin (ZITHROMAX) 500 mg in sodium chloride 0.9 % 250 mL IVPB  Status:  Discontinued        500 mg 250 mL/hr over 60 Minutes Intravenous Every 24 hours 06/13/23 1458 06/15/23 0756   06/14/23 1000  cefTRIAXone (ROCEPHIN) 2 g in sodium chloride 0.9 % 100 mL IVPB        2 g 200 mL/hr over 30 Minutes Intravenous Every 24 hours 06/13/23 1458 06/19/23 0959   06/13/23 1600  cefTRIAXone (ROCEPHIN) 1 g in sodium chloride 0.9 % 100 mL IVPB        1 g 200 mL/hr over 30 Minutes Intravenous  Once 06/13/23 1554 06/13/23 1804   06/13/23 1445  cefTRIAXone  (ROCEPHIN) 1 g in sodium chloride 0.9 % 100 mL IVPB        1 g 200 mL/hr over 30 Minutes Intravenous  Once 06/13/23 1433 06/13/23 1555   06/13/23 1445  azithromycin (ZITHROMAX) 500 mg in sodium chloride 0.9 % 250 mL IVPB        500 mg 250 mL/hr over 60 Minutes Intravenous  Once 06/13/23 1433 06/13/23 1657          Medications  benzonatate  200 mg Oral TID   brimonidine  1 drop Both Eyes BID   carvedilol  6.25 mg Oral BID WC   enoxaparin (LOVENOX) injection  40 mg Subcutaneous QHS   furosemide  20 mg Oral Daily   latanoprost  1 drop Both Eyes QHS   losartan  25 mg Oral Daily   melatonin  3 mg Oral QHS   sodium chloride flush  3 mL Intravenous Q12H      Subjective:   Caoilainn Sacks was seen and examined today.  Still coughing but overall improving, no fevers or chills.  No acute chest pain or shortness of breath.    Objective:   Vitals:   06/17/23 0408 06/17/23 0719 06/17/23 0743 06/17/23 1146  BP: 137/76  (!) 143/65 134/84  Pulse: 88  80 79  Resp: 18  16 17   Temp: (!) 97.5 F (36.4 C)  97.8 F (36.6 C) 98.3 F (36.8 C)  TempSrc: Oral  Oral   SpO2: (!) 84% 98% 93% 94%  Weight:      Height:        Intake/Output Summary (Last 24 hours) at 06/17/2023 1318 Last data filed at 06/17/2023 0957 Gross per 24 hour  Intake 170 ml  Output --  Net 170 ml     Wt Readings from Last 3 Encounters:  06/16/23 66.1 kg  01/11/21 63.5 kg  06/07/20 59 kg   Physical Exam General: Alert and oriented x 3, NAD, coughing Cardiovascular: S1 S2 clear, RRR.  Respiratory: Bilateral scattered rhonchi, no wheezing Gastrointestinal: Soft, nontender, nondistended, NBS Ext: no pedal edema bilaterally Neuro: no new deficits Psych: Normal affect  Data Reviewed:  I have personally reviewed following labs    CBC Lab Results  Component Value Date   WBC 14.0 (H) 06/17/2023   RBC 4.93 06/17/2023   HGB 14.2 06/17/2023   HCT 43.4 06/17/2023   MCV 88.0 06/17/2023   MCH  28.8  06/17/2023   PLT 414 (H) 06/17/2023   MCHC 32.7 06/17/2023   RDW 13.0 06/17/2023   LYMPHSABS 1.2 01/14/2021   MONOABS 0.5 01/14/2021   EOSABS 0.0 01/14/2021   BASOSABS 0.0 01/14/2021     Last metabolic panel Lab Results  Component Value Date   NA 137 06/17/2023   K 3.7 06/17/2023   CL 102 06/17/2023   CO2 28 06/17/2023   BUN 15 06/17/2023   CREATININE 0.72 06/17/2023   GLUCOSE 109 (H) 06/17/2023   GFRNONAA >60 06/17/2023   GFRAA >60 09/09/2019   CALCIUM 8.8 (L) 06/17/2023   PHOS 4.2 01/14/2021   PROT 6.2 (L) 06/14/2023   ALBUMIN 2.3 (L) 06/14/2023   BILITOT 0.4 06/14/2023   ALKPHOS 65 06/14/2023   AST 20 06/14/2023   ALT 15 06/14/2023   ANIONGAP 7 06/17/2023    CBG (last 3)  Recent Labs    06/17/23 0944 06/17/23 1149  GLUCAP 160* 128*      Coagulation Profile: No results for input(s): "INR", "PROTIME" in the last 168 hours.   Radiology Studies: I have personally reviewed the imaging studies  DG Swallowing Func-Speech Pathology  Result Date: 06/17/2023 Table formatting from the original result was not included. Modified Barium Swallow Study Patient Details Name: ROSILAND SEN MRN: [id number] Date of Birth: [birth year] Today's Date: 06/17/2023 HPI/PMH: HPI: Mykell Genao is an 82 y.o. female who presented with cough and shortness of breath.   Patient reported she has been experiencing some cough with associated shortness of breath for the past week.  Denies any known sick contacts. CXR 7/18: "Persistent asymmetric airspace disease within the left base compatible with pneumonia." Chest CT 7/18: "Consolidative opacities in dependent portions of the left-greater-than-right lower lobes, suspicious for aspiration or infection."  Pt with hx CHF, hypertension, hard of hearing. Clinical Impression: Clinical Impression: Pt presents with functional oropharyngeal swallow with delayed swallow initiation at the vallecula with solids and pyriforms with liquids.  There  was trace, transient penetration of thin liquid before the swallow, above the level of the vocal folds during pill simulation, which fully cleared during swallow.  There was no penetration of thin liquids in isolation or of any other consistencies trialed today.  Pt noted to talk prior to swallow with full bolus of liquid in pharynx, pooled in the vallecula and pyriform sinuses without any penetration.  During pill simulation there was esopheal retention of tablet and mild backflow with in the esophagus.  There was progression of tablet with liquid wash, and full clearance with puree bolus.  Consider giving meds whole with puree, or at least following medication administration with bites of puree. Pt has no further ST needs. SLP will sign off. Recommend continuing current mechanical soft diet with thin liquids, where pt does not have dentures present this admission, but pt is safe to advance to a regular texture diet if desired. Factors that may increase risk of adverse event in presence of aspiration Rubye Oaks & Clearance Coots 2021): Factors that may increase risk of adverse event in presence of aspiration Rubye Oaks & Clearance Coots 2021): Respiratory or GI disease Recommendations/Plan: Swallowing Evaluation Recommendations Swallowing Evaluation Recommendations Recommendations: PO diet PO Diet Recommendation: Dysphagia 3 (Mechanical soft); Thin liquids (Level 0) (may advance to regular if desired) Liquid Administration via: Cup; Straw Medication Administration: Whole meds with puree (or whole with liquid giving bites of puree to follow) Swallowing strategies  : Slow rate; Small bites/sips Postural changes: Position pt fully upright for meals; Stay upright 30-60  min after meals Oral care recommendations: Oral care BID (2x/day) Treatment Plan Treatment Plan Treatment recommendations: No treatment recommended at this time Follow-up recommendations: No SLP follow up Functional status assessment: Patient has not had a recent decline in  their functional status. Treatment frequency: -- (N/A) Interventions: -- (N/A) Recommendations Recommendations for follow up therapy are one component of a multi-disciplinary discharge planning process, led by the attending physician.  Recommendations may be updated based on patient status, additional functional criteria and insurance authorization. Assessment: Orofacial Exam: Orofacial Exam Oral Cavity: Oral Hygiene: WFL Oral Cavity - Dentition: Edentulous; Dentures, not available Orofacial Anatomy: WFL Oral Motor/Sensory Function: -- (See BSE) Anatomy: Anatomy: WFL Boluses Administered: Boluses Administered Boluses Administered: Thin liquids (Level 0); Mildly thick liquids (Level 2, nectar thick); Moderately thick liquids (Level 3, honey thick); Puree; Solid  Oral Impairment Domain: Oral Impairment Domain Lip Closure: No labial escape Tongue control during bolus hold: Posterior escape of greater than half of bolus Bolus preparation/mastication: Slow prolonged chewing/mashing with complete recollection Bolus transport/lingual motion: Brisk tongue motion Oral residue: Complete oral clearance Location of oral residue : N/A Initiation of pharyngeal swallow : Pyriform sinuses  Pharyngeal Impairment Domain: Pharyngeal Impairment Domain Soft palate elevation: No bolus between soft palate (SP)/pharyngeal wall (PW) Laryngeal elevation: Complete superior movement of thyroid cartilage with complete approximation of arytenoids to epiglottic petiole Anterior hyoid excursion: Complete anterior movement Epiglottic movement: Complete inversion Laryngeal vestibule closure: Complete, no air/contrast in laryngeal vestibule Pharyngeal stripping wave : Present - complete Pharyngeal contraction (A/P view only): N/A Pharyngoesophageal segment opening: Complete distension and complete duration, no obstruction of flow Tongue base retraction: No contrast between tongue base and posterior pharyngeal wall (PPW) Pharyngeal residue: Trace  residue within or on pharyngeal structures Location of pharyngeal residue: Pyriform sinuses  Esophageal Impairment Domain: Esophageal Impairment Domain Esophageal clearance upright position: Esophageal retention; Esophageal retention with retrograde flow below pharyngoesophageal segment (PES) Pill: Pill Consistency administered: Thin liquids (Level 0) Penetration/Aspiration Scale Score: Penetration/Aspiration Scale Score 1.  Material does not enter airway: Mildly thick liquids (Level 2, nectar thick); Moderately thick liquids (Level 3, honey thick); Solid; Puree; Pill; Thin liquids (Level 0) 2.  Material enters airway, remains ABOVE vocal cords then ejected out: Thin liquids (Level 0) (will pill simulation) Compensatory Strategies: Compensatory Strategies Compensatory strategies: No   General Information: Caregiver present: No  Diet Prior to this Study: Regular; Thin liquids (Level 0)   No data recorded  No data recorded  Supplemental O2: High flow nasal cannula   History of Recent Intubation: No  Behavior/Cognition: Alert; Distractible No data recorded Baseline vocal quality/speech: Normal No data recorded No data recorded Exam Limitations: Limited visibility (intermittent decreased visibility of larynx 2/2 shoulder position) Goal Planning: Prognosis for improved oropharyngeal function: -- (N/A) No data recorded No data recorded Patient/Family Stated Goal: To go home Consulted and agree with results and recommendations: Patient; Nurse Pain: Pain Assessment Pain Assessment: Faces Faces Pain Scale: 0 End of Session: Start Time:SLP Start Time (ACUTE ONLY): 0915 Stop Time: SLP Stop Time (ACUTE ONLY): 0926 Time Calculation:SLP Time Calculation (min) (ACUTE ONLY): 11 min Charges: SLP Evaluations $ SLP Speech Visit: 1 Visit SLP Evaluations $MBS Swallow: 1 Procedure SLP visit diagnosis: SLP Visit Diagnosis: Dysphagia, unspecified (R13.10) Past Medical History: Past Medical History: Diagnosis Date  Arthritis   CHF  (congestive heart failure) (HCC)   HOH (hard of hearing)  Past Surgical History: Past Surgical History: Procedure Laterality Date  INTRAMEDULLARY (IM) NAIL INTERTROCHANTERIC Left 09/07/2019  INTRAMEDULLARY (IM)  NAIL INTERTROCHANTRIC (Left Hip)  INTRAMEDULLARY (IM) NAIL INTERTROCHANTERIC Left 09/07/2019  Procedure: INTRAMEDULLARY (IM) NAIL INTERTROCHANTRIC;  Surgeon: Roby Lofts, MD;  Location: [facility] OR;  Service: Orthopedics;  Laterality: Left; Kerrie Pleasure, MA, CCC-SLP Acute Rehabilitation Services Office: [phone] 06/17/2023, 10:30 AM      Thad Ranger M.D. [facility] 06/17/2023, 1:18 PM  Available via Epic secure chat 7am-7pm After 7 pm, please refer to night coverage provider listed on amion.

## 2023-06-18 DIAGNOSIS — J9601 Acute respiratory failure with hypoxia: Secondary | ICD-10-CM | POA: Diagnosis not present

## 2023-06-18 LAB — BASIC METABOLIC PANEL
Anion gap: 11 (ref 5–15)
BUN: 15 mg/dL (ref 8–23)
CO2: 27 mmol/L (ref 22–32)
Calcium: 9 mg/dL (ref 8.9–10.3)
Chloride: 98 mmol/L (ref 98–111)
Creatinine, Ser: 0.71 mg/dL (ref 0.44–1.00)
GFR, Estimated: >60 mL/min (ref >60)
Glucose, Bld: 106 mg/dL — ABNORMAL HIGH (ref 70–99)
Potassium: 3.8 mmol/L (ref 3.5–5.1)
Sodium: 136 mmol/L (ref 135–145)

## 2023-06-18 LAB — CBC
HCT: 46.1 % — ABNORMAL HIGH (ref 36.0–46.0)
Hemoglobin: 15.2 g/dL — ABNORMAL HIGH (ref 12.0–15.0)
MCH: 29.1 pg (ref 26.0–34.0)
MCHC: 33 g/dL (ref 30.0–36.0)
MCV: 88.1 fL (ref 80.0–100.0)
Platelets: 413 10*3/uL — ABNORMAL HIGH (ref 150–400)
RBC: 5.23 MIL/uL — ABNORMAL HIGH (ref 3.87–5.11)
RDW: 12.9 % (ref 11.5–15.5)
WBC: 12 10*3/uL — ABNORMAL HIGH (ref 4.0–10.5)
nRBC: 0 % (ref 0.0–0.2)

## 2023-06-18 LAB — CULTURE, BLOOD (ROUTINE X 2): Culture: NO GROWTH

## 2023-06-18 NOTE — TOC Progression Note (Addendum)
Transition of Care [facility]) - Progression Note    Patient Details  Name: Annette Berry MRN: [id number] Date of Birth: Dec 20, 1940  [facility] CM/SW Contact  Armelia Penton A [place], [place] Phone Number: 06/18/2023, 1:37 PM  Clinical Narrative:      Update 1720 CSW made another attempt, continue to receive error message from [facility]. CSW will contact [employer] and start auth without use of the portal.    CSW made attempt to complete pt's authorization to [facility]. CSW received error message from the [facility] transitions portal 2x. CSW will try again at another time to complete authorization.   TOC will continue to follow.    Expected Discharge Plan: Skilled Nursing Facility Barriers to Discharge: Continued Medical Work up  Expected Discharge Plan and Services   Discharge Planning Services: CM Consult Post Acute Care Choice: Durable Medical Equipment Living arrangements for the past 2 months: Single Family Home                                       Social Determinants of Health (SDOH) Interventions SDOH Screenings   Food Insecurity: No Food Insecurity (06/13/2023)  Housing: Low Risk  (06/13/2023)  Transportation Needs: No Transportation Needs (06/13/2023)  Utilities: Not At Risk (06/13/2023)  Tobacco Use: High Risk (06/13/2023)    Readmission Risk Interventions     No data to display

## 2023-06-18 NOTE — Progress Notes (Signed)
SATURATION QUALIFICATIONS: (This note is used to comply with regulatory documentation for home oxygen)  Patient Saturations on Room Air at Rest = 92%  Patient Saturations on Room Air while marching in place = 89%  Patient Saturations on 2 Liters of oxygen while marching in place = 91%  Please briefly explain why patient needs home oxygen:Pt desaturates on room air with mobility and requires supplemental oxygen to maintain a safe saturation level.  Lenora Boys. PTA Acute Rehabilitation Services Office: [phone]

## 2023-06-18 NOTE — Progress Notes (Signed)
    Durable Medical Equipment  (From admission, onward)           Start     Ordered   06/17/23 1632  For home use only DME oxygen  Once       Question Answer Comment  Length of Need 6 Months   Mode or (Route) Nasal cannula   Liters per Minute 2   Frequency Continuous (stationary and portable oxygen unit needed)   Oxygen conserving device Yes   Oxygen delivery system Gas      06/17/23 1632   06/17/23 1554  For home use only DME 3 n 1  Once       Comments: Patient needs 3:1 since patient will be confined to one room in the home.   06/17/23 1554

## 2023-06-18 NOTE — Progress Notes (Signed)
Physical Therapy Treatment Patient Details Name: Annette Berry MRN: [id number] DOB: March 05, 1941 Today's Date: 06/18/2023   History of Present Illness 82 y.o. female who presented 06/13/23 with cough and shortness of breath. CXR 7/18: compatible with pneumonia."  Pt with hx CHF, hypertension, hard of hearing.    PT Comments  Pt greeted resting in bed and agreeable to session with slow but steady progress towards acute goals. Pt requiring light min A to come to stand progressing to min guard with practice. Pt able to complete pre-gait activities with RW support however pt quick to fatigue and returning to sitting. Pt able to take some steps along EOB to [facility], however pt declining further gait due to fatigue and weakness. Educated pt on importance of continued mobility and time up OOB to maintain strength with pt verbalizing understanding. Pt continues to benefit from skilled PT services to progress toward functional mobility goals.    Assistance Recommended at Discharge PRN  If plan is discharge home, recommend the following:  Can travel by private vehicle    A little help with walking and/or transfers;Assistance with cooking/housework;Direct supervision/assist for medications management;Direct supervision/assist for financial management;Assist for transportation;Help with stairs or ramp for entrance;A little help with bathing/dressing/bathroom      Equipment Recommendations  None recommended by PT    Recommendations for Other Services       Precautions / Restrictions Precautions Precautions: Fall Restrictions Weight Bearing Restrictions: No     Mobility  Bed Mobility Overal bed mobility: Needs Assistance Bed Mobility: Supine to Sit, Sit to Supine     Supine to sit: Min assist Sit to supine: Min guard   General bed mobility comments: light assist to elevate trunk with pt reaching out for HHA, min guard and increased time to return to supine    Transfers Overall  transfer level: Needs assistance Equipment used: Rolling walker (2 wheels) Transfers: Sit to/from Stand Sit to Stand: Min assist           General transfer comment: light min A to rise to stand x2, down to min guard with repeated practice, performing x4 throughout session    Ambulation/Gait             Pre-gait activities: able to march in place ~60 seconds before fatigue and pt sitting, able to take side steps toward [facility] General Gait Details: pt declining after standing >3 minutes for pericare (had been incontinent of B/B prior to arrival)   Optometrist     Tilt Bed    Modified Rankin (Stroke Patients Only)       Balance Overall balance assessment: Needs assistance Sitting-balance support: No upper extremity supported, Feet supported Sitting balance-Leahy Scale: Fair     Standing balance support: Bilateral upper extremity supported, Reliant on assistive device for balance Standing balance-Leahy Scale: Poor Standing balance comment: reliant on BUE support                            Cognition Arousal/Alertness: Awake/alert Behavior During Therapy: WFL for tasks assessed/performed Overall Cognitive Status: No family/caregiver present to determine baseline cognitive functioning                                 General Comments: Poor insight into current situation.        Exercises Other  Exercises Other Exercises: marching in place ~60 seconds    General Comments General comments (skin integrity, edema, etc.): SpO2 92% on RA at rest, decreasing to 89% on RA while marching in place, reapplied 2L with improvement to 91%      Pertinent Vitals/Pain Pain Assessment Pain Assessment: Faces Faces Pain Scale: No hurt Pain Intervention(s): Monitored during session    Home Living                          Prior Function            PT Goals (current goals can now be found in the care plan  section) Acute Rehab PT Goals Patient Stated Goal: home PT Goal Formulation: With patient Time For Goal Achievement: 06/29/23 Progress towards PT goals: Progressing toward goals    Frequency    Min 3X/week      PT Plan Current plan remains appropriate    Co-evaluation              AM-PAC PT "6 Clicks" Mobility   Outcome Measure  Help needed turning from your back to your side while in a flat bed without using bedrails?: None Help needed moving from lying on your back to sitting on the side of a flat bed without using bedrails?: A Little Help needed moving to and from a bed to a chair (including a wheelchair)?: A Little Help needed standing up from a chair using your arms (e.g., wheelchair or bedside chair)?: A Little Help needed to walk in hospital room?: A Lot Help needed climbing 3-5 steps with a railing? : Total 6 Click Score: 16    End of Session Equipment Utilized During Treatment: Oxygen Activity Tolerance: Patient limited by fatigue;Patient tolerated treatment well Patient left: in bed;with call bell/phone within reach;with bed alarm set Nurse Communication: Mobility status PT Visit Diagnosis: Other abnormalities of gait and mobility (R26.89);Muscle weakness (generalized) (M62.81)     Time: 0940-1010 PT Time Calculation (min) (ACUTE ONLY): 30 min  Charges:    $Therapeutic Activity: 23-37 mins PT General Charges $$ ACUTE PT VISIT: 1 Visit                     Kunta Hilleary R. PTA Acute Rehabilitation Services Office: [phone]   Catalina Antigua 06/18/2023, 10:17 AM

## 2023-06-18 NOTE — Progress Notes (Signed)
[facility] PROGRESS NOTE  Annette Berry (DOB: 06-Feb-1941) WGN:[id number] PCP: Kaleen Mask, MD  Brief Narrative: Annette Berry is an 82 y.o. female with a history of CHF, HTN who presented 7/18 with cough and shortness of breath. CTA PE study was negative for PE but did show consolidation of the left greater than right base suspicious for aspiration versus infection. She was hypoxemic and admitted for pneumonia. Currently awaiting disposition to SNF.  Subjective: No new complaints  Objective: BP 119/66   Pulse 82   Temp (!) 97.4 F (36.3 C)   Resp 18   Ht 5\' 4"  (1.626 m)   Wt 66.1 kg   SpO2 93%   BMI 25.01 kg/m   Gen: Elderly, no distress Pulm: Diminished without crackles or wheezes  CV: RRR GI: Soft, NT, ND, +BS  Neuro: Alert and oriented, HOH. No new focal deficits. Ext: Warm, no deformities Skin: No new rashes, lesions or ulcers on visualized skin   Assessment & Plan: Acute respiratory failure with hypoxia: Due to multifocal pneumonia, may have underlying undiagnosed COPD, CHF  - O2 weaned to 2L, continues to qualify, will plan to DC with supplemental oxygen. - SLP/MBS done, recommended dysphagia 3 diet   Multifocal pneumonia:  - SLP eval /MBS-> dysphagia 3 diet with thin liquids - continue albuterol nebs, flutter valve, O2   - Completed 5 days ceftriaxone, azithromycin.   Acute on chronic HFpEF, HTN: Mild exacerbation. BNP 265.4. Echo showed EF of 60 to 65%, G1 DD, normal RV systolic function - Received Lasix 20 mg IV daily x 3 doses. Transitioned to PO lasix, continue Lasix 20 mg daily - Strict I's and O's and daily weights - Continue losartan, coreg   13 x 7 mm RUL pulmonary nodule: Incidental finding on CT scan - Recommended 3 month follow up CT or PET-CT   Severe sepsis due to pneumonia: Resolved. Met SIRS criteria with tachycardia, mild tachypnea, lactic acidosis, hypoxia, source likey PNA    AECOPD:  - Needs formal PFTs  after resolution to confirm suspected Dx COPD.   Tyrone Nine, MD [facility] [email or web address] 06/18/2023, 11:31 AM

## 2023-06-18 NOTE — NC FL2 (Signed)
[place] MEDICAID FL2 LEVEL OF CARE FORM     IDENTIFICATION  Patient Name: Annette Berry Birthdate: 22-Oct-1941 Sex: female Admission Date (Current Location): 06/13/2023  [facility] and [place] Number:  Producer, television/film/video and Address:  [facility], [address] [place], [place] [address]      Provider Number: [id number]  Attending Physician Name and Address:  Tyrone Nine, MD  Relative Name and Phone Number:  Terresa Marlett, son - [phone]    Current Level of Care: Hospital Recommended Level of Care: Skilled Nursing Facility Prior Approval Number:    Date Approved/Denied:   PASRR Number: [id number]  Discharge Plan: SNF    Current Diagnoses: Patient Active Problem List   Diagnosis Date Noted   Respiratory failure with hypoxia (HCC) 06/14/2023   HTN (hypertension) 06/13/2023   CAP (community acquired pneumonia) 06/13/2023   Acute respiratory failure with hypoxia (HCC) 06/13/2023   Acute delirium    COVID-19 01/11/2021   Tobacco abuse 09/10/2019   Fall    Closed comminuted intertrochanteric fracture of left femur (HCC) 09/07/2019   Vitamin D deficiency 09/07/2019    Orientation RESPIRATION BLADDER Height & Weight     Self, Time, Situation, Place  O2 Incontinent Weight: 66.1 kg Height:  5\' 4"  (162.6 cm)  BEHAVIORAL SYMPTOMS/MOOD NEUROLOGICAL BOWEL NUTRITION STATUS      Continent    AMBULATORY STATUS COMMUNICATION OF NEEDS Skin   Extensive Assist Verbally Normal                       Personal Care Assistance Level of Assistance  Bathing, Feeding, Dressing Bathing Assistance: Limited assistance Feeding assistance: Independent Dressing Assistance: Limited assistance     Functional Limitations Info  Sight, Hearing, Speech Sight Info: Impaired (wears glasses) Hearing Info: Adequate Speech Info: Adequate    SPECIAL CARE FACTORS FREQUENCY  PT (By licensed PT), OT (By licensed OT)     PT Frequency: 5 x per  week OT Frequency: 5 x per week            Contractures Contractures Info: Not present    Additional Factors Info  Code Status, Allergies Code Status Info: Full code Allergies Info: NKDA           Current Medications (06/18/2023):  This is the current hospital active medication list Current Facility-Administered Medications  Medication Dose Route Frequency Provider Last Rate Last Admin   acetaminophen (TYLENOL) tablet 650 mg  650 mg Oral Q6H PRN Synetta Fail, MD       Or   acetaminophen (TYLENOL) suppository 650 mg  650 mg Rectal Q6H PRN Synetta Fail, MD       albuterol (PROVENTIL) (2.5 MG/3ML) 0.083% nebulizer solution 2.5 mg  2.5 mg Nebulization Q2H PRN Synetta Fail, MD       benzonatate (TESSALON) capsule 200 mg  200 mg Oral TID Rai, Ripudeep K, MD   200 mg at 06/18/23 1019   brimonidine (ALPHAGAN) 0.15 % ophthalmic solution 1 drop  1 drop Both Eyes BID Synetta Fail, MD   1 drop at 06/18/23 1026   carvedilol (COREG) tablet 6.25 mg  6.25 mg Oral BID WC Rai, Ripudeep K, MD   6.25 mg at 06/18/23 0841   cefTRIAXone (ROCEPHIN) 2 g in sodium chloride 0.9 % 100 mL IVPB  2 g Intravenous Q24H Synetta Fail, MD 200 mL/hr at 06/18/23 1025 2 g at 06/18/23 1025   enoxaparin (LOVENOX)  injection 40 mg  40 mg Subcutaneous QHS Synetta Fail, MD   40 mg at 06/17/23 2212   furosemide (LASIX) tablet 20 mg  20 mg Oral Daily Rai, Ripudeep K, MD   20 mg at 06/18/23 1019   guaiFENesin-dextromethorphan (ROBITUSSIN DM) 100-10 MG/5ML syrup 5 mL  5 mL Oral Q4H PRN Rai, Ripudeep K, MD   5 mL at 06/17/23 2211   latanoprost (XALATAN) 0.005 % ophthalmic solution 1 drop  1 drop Both Eyes QHS Synetta Fail, MD   1 drop at 06/17/23 2213   losartan (COZAAR) tablet 25 mg  25 mg Oral Daily Synetta Fail, MD   25 mg at 06/18/23 1019   melatonin tablet 3 mg  3 mg Oral QHS Rai, Ripudeep K, MD   3 mg at 06/17/23 2211   polyethylene glycol (MIRALAX / GLYCOLAX) packet 17  g  17 g Oral Daily PRN Synetta Fail, MD       sodium chloride flush (NS) 0.9 % injection 3 mL  3 mL Intravenous Q12H Synetta Fail, MD   3 mL at 06/18/23 1026     Discharge Medications: Please see discharge summary for a list of discharge medications.  Relevant Imaging Results:  Relevant Lab Results:   Additional Information [id number]  Janae Bridgeman, RN

## 2023-06-18 NOTE — Progress Notes (Signed)
Patient did refuse to take ambulatory saturation. RN kept on trying at several times and patient did not allow.

## 2023-06-18 NOTE — TOC Progression Note (Addendum)
Transition of Care [facility]) - Progression Note    Patient Details  Name: Annette Berry MRN: [id number] Date of Birth: September 26, 1941  [facility]) CM/SW Contact  Janae Bridgeman, RN Phone Number: 06/18/2023, 10:42 AM  Clinical Narrative:    CM met with the patient and son at the bedside and patient is agreeable to go to short term SNF.  Patient lives at home alone and is currently needs oxygen and therapy needs.  Patient states that she prefers [facility] or [place].   Patient was faxed out in the hub for bed offers.  Casilda Carls, MSW is aware and will follow up with the family for SNF placement.  Attending MD was updated regarding pending SNF work up.  06/18/23 1153 - [facility] offered a bed offer to the patient.  I called the son and he accepted the bed offer.  [facility], MSW was notified and was asked to start insurance authorization for placement.   Expected Discharge Plan: Skilled Nursing Facility Barriers to Discharge: Continued Medical Work up  Expected Discharge Plan and Services   Discharge Planning Services: CM Consult Post Acute Care Choice: Durable Medical Equipment Living arrangements for the past 2 months: Single Family Home                                       Social Determinants of Health (SDOH) Interventions SDOH Screenings   Food Insecurity: No Food Insecurity (06/13/2023)  Housing: Low Risk  (06/13/2023)  Transportation Needs: No Transportation Needs (06/13/2023)  Utilities: Not At Risk (06/13/2023)  Tobacco Use: High Risk (06/13/2023)    Readmission Risk Interventions     No data to display

## 2023-06-18 NOTE — Plan of Care (Signed)
  Problem: Education: Goal: Knowledge of disease or condition will improve Outcome: Progressing   Problem: Activity: Goal: Ability to tolerate increased activity will improve Outcome: Progressing   Problem: Respiratory: Goal: Ability to maintain adequate ventilation will improve Outcome: Progressing   Problem: Activity: Goal: Ability to tolerate increased activity will improve Outcome: Progressing   Problem: Clinical Measurements: Goal: Ability to maintain a body temperature in the normal range will improve Outcome: Progressing   Problem: Education: Goal: Knowledge of General Education information will improve Description: Including pain rating scale, medication(s)/side effects and non-pharmacologic comfort measures Outcome: Progressing   Problem: Activity: Goal: Risk for activity intolerance will decrease Outcome: Progressing   Problem: Coping: Goal: Level of anxiety will decrease Outcome: Progressing

## 2023-06-19 DIAGNOSIS — J9601 Acute respiratory failure with hypoxia: Secondary | ICD-10-CM | POA: Diagnosis not present

## 2023-06-19 MED ORDER — GUAIFENESIN-DM 100-10 MG/5ML PO SYRP: 5.0000 mL | ORAL_SOLUTION | ORAL | 0 refills | Status: AC | PRN

## 2023-06-19 MED ORDER — CARVEDILOL 6.25 MG PO TABS: 6.2500 mg | ORAL_TABLET | Freq: Two times a day (BID) | ORAL | 0 refills | Status: AC

## 2023-06-19 MED ORDER — FUROSEMIDE 20 MG PO TABS: 20.0000 mg | ORAL_TABLET | Freq: Every day | ORAL | 0 refills | Status: AC

## 2023-06-19 NOTE — TOC Progression Note (Signed)
Transition of Care [facility]) - Progression Note    Patient Details  Name: Annette Berry MRN: [id number] Date of Birth: [birth year]  Transition of Care [facility]) CM/SW Contact  Janei Scheff A [place], [place] Phone Number: 06/19/2023, 10:43 AM  Clinical Narrative:     CSW contacted [employer] by phone, [phone] and started an authorization request for the pt.   CSW uploaded requested documentation to [facility] transitions website.   Auth remains pending.   TOC will continue to follow.    Expected Discharge Plan: Skilled Nursing Facility Barriers to Discharge: Continued Medical Work up  Expected Discharge Plan and Services   Discharge Planning Services: CM Consult Post Acute Care Choice: Durable Medical Equipment Living arrangements for the past 2 months: Single Family Home Expected Discharge Date: 06/19/23                                     Social Determinants of Health (SDOH) Interventions SDOH Screenings   Food Insecurity: No Food Insecurity (06/13/2023)  Housing: Low Risk  (06/13/2023)  Transportation Needs: No Transportation Needs (06/13/2023)  Utilities: Not At Risk (06/13/2023)  Tobacco Use: High Risk (06/13/2023)    Readmission Risk Interventions     No data to display

## 2023-06-19 NOTE — Plan of Care (Signed)
  Problem: Activity: Goal: Will verbalize the importance of balancing activity with adequate rest periods Outcome: Progressing   Problem: Respiratory: Goal: Ability to maintain a clear airway will improve Outcome: Progressing   Problem: Clinical Measurements: Goal: Ability to maintain a body temperature in the normal range will improve Outcome: Progressing   Problem: Health Behavior/Discharge Planning: Goal: Ability to manage health-related needs will improve Outcome: Progressing   Problem: Clinical Measurements: Goal: Will remain free from infection Outcome: Progressing   Problem: Pain Managment: Goal: General experience of comfort will improve Outcome: Progressing   Problem: Safety: Goal: Ability to remain free from injury will improve Outcome: Progressing   Problem: Skin Integrity: Goal: Risk for impaired skin integrity will decrease Outcome: Progressing

## 2023-06-19 NOTE — Progress Notes (Signed)
Occupational Therapy Treatment Patient Details Name: Annette Berry MRN: [id number] DOB: 07-25-1941 Today's Date: 06/19/2023   History of present illness 82 y.o. female who presented 06/13/23 with cough and shortness of breath. CXR 7/18: compatible with pneumonia."  Pt with hx CHF, hypertension, hard of hearing.   OT comments  Pt with slow progress toward OT goals; limited by poor insight, command following, and cognition. Pt attempting EOB on arrival with poor skill to achieve her balance sitting EOB . Pt able to eat EOB once provided min A and cues to achieve feet on floor with min guard approaching supervision. Pt reporting she is soiled but refusing up to [facility] or standing for pericare, so performing pericare and repositioning at bed level.    Recommendations for follow up therapy are one component of a multi-disciplinary discharge planning process, led by the attending physician.  Recommendations may be updated based on patient status, additional functional criteria and insurance authorization.    Assistance Recommended at Discharge Frequent or constant Supervision/Assistance  Patient can return home with the following  A little help with walking and/or transfers;A little help with bathing/dressing/bathroom;Assistance with cooking/housework;Direct supervision/assist for financial management;Direct supervision/assist for medications management;Assist for transportation;Help with stairs or ramp for entrance   Equipment Recommendations  Other (comment) (defer)    Recommendations for Other Services      Precautions / Restrictions Precautions Precautions: Fall Restrictions Weight Bearing Restrictions: No       Mobility Bed Mobility Overal bed mobility: Needs Assistance Bed Mobility: Supine to Sit, Sit to Supine     Supine to sit: Min assist Sit to supine: Min guard   General bed mobility comments: light assist to elevate trunk with pt reaching out for HHA then cues for  scooting foraward to EOB, min guard and increased time to return to supine    Transfers                   General transfer comment: Pt deferred     Balance Overall balance assessment: Needs assistance Sitting-balance support: No upper extremity supported, Feet supported Sitting balance-Leahy Scale: Fair Sitting balance - Comments: with feet supported. unable to maintain upright with feet dangling                                   ADL either performed or assessed with clinical judgement   ADL Overall ADL's : Needs assistance/impaired Eating/Feeding: Sitting;Minimal assistance (Min guard-min A) Eating/Feeding Details (indicate cue type and reason): Pt attempting to sit EOB on arrival and with poor core strength to maintain sitting balance, improved to min guard approaching supervision with OT repositioning and pt feet on floor.             Upper Body Dressing : Set up;Sitting Upper Body Dressing Details (indicate cue type and reason): donning her shirt at EOB Lower Body Dressing: Maximal assistance;Sitting/lateral leans Lower Body Dressing Details (indicate cue type and reason): to don new pants; max A due to pt max internally distratced and difficulty attenting to task.   Toilet Transfer Details (indicate cue type and reason): Pt refusing STS this session Toileting- Clothing Manipulation and Hygiene: Bed level;Total assistance         General ADL Comments: Pt attempting to sit EOB to eat breakfast on arrival but unable to maintain sititg balance with BLE dangling from EOB; repositioned at EOB with feet on floor and pt able to self  feed several bites as well as drink from cup with straw with min guard-supervision for balance. Pt reporting she is soiled but refusing to stand for pericare or get to a BSC, thus, returned to supine for pericare.    Extremity/Trunk Assessment Upper Extremity Assessment Upper Extremity Assessment: Generalized weakness   Lower  Extremity Assessment Lower Extremity Assessment: Generalized weakness        Vision       Perception     Praxis      Cognition Arousal/Alertness: Awake/alert Behavior During Therapy: WFL for tasks assessed/performed Overall Cognitive Status: No family/caregiver present to determine baseline cognitive functioning                                 General Comments: Poor insight into current situation. inconsistently oriented to location. Follows one step commands when has intrinsic motivation to perform task, but otherwise selectively does not follow commands at times. Poor awareness overall. Able to communicate basic needs "I am soiled, I want water, etc". Often masks cognitive deficits with humor        Exercises      Shoulder Instructions       General Comments VSS    Pertinent Vitals/ Pain       Pain Assessment Pain Assessment: Faces Faces Pain Scale: No hurt Pain Intervention(s): Monitored during session  Home Living                                          Prior Functioning/Environment              Frequency  Min 2X/week        Progress Toward Goals  OT Goals(current goals can now be found in the care plan section)  Progress towards OT goals: Progressing toward goals  Acute Rehab OT Goals Patient Stated Goal: get better OT Goal Formulation: With patient Time For Goal Achievement: 06/30/23 Potential to Achieve Goals: Good ADL Goals Pt Will Perform Grooming: standing;with min guard assist Pt Will Perform Lower Body Dressing: with supervision;sit to/from stand Pt Will Transfer to Toilet: with min guard assist;ambulating Additional ADL Goal #1: Pt will perform 5+ mins OOB activity to optimize activity tolerance with min guard A.  Plan Discharge plan needs to be updated;Frequency remains appropriate    Co-evaluation                 AM-PAC OT "6 Clicks" Daily Activity     Outcome Measure   Help from  another person eating meals?: None Help from another person taking care of personal grooming?: A Little Help from another person toileting, which includes using toliet, bedpan, or urinal?: A Lot Help from another person bathing (including washing, rinsing, drying)?: A Little Help from another person to put on and taking off regular upper body clothing?: A Little Help from another person to put on and taking off regular lower body clothing?: A Lot 6 Click Score: 17    End of Session    OT Visit Diagnosis: Unsteadiness on feet (R26.81);Muscle weakness (generalized) (M62.81);Other symptoms and signs involving cognitive function   Activity Tolerance Patient tolerated treatment well   Patient Left in bed;with call bell/phone within reach;with bed alarm set   Nurse Communication Mobility status        Time: 4540-9811 OT Time Calculation (min): 36  min  Charges: OT General Charges $OT Visit: 1 Visit OT Treatments $Self Care/Home Management : 23-37 mins  Tyler Deis, OTR/L [facility] Acute Rehabilitation Office: [phone]   Annette Berry 06/19/2023, 11:58 AM

## 2023-06-19 NOTE — Discharge Summary (Signed)
Physician Discharge Summary   Patient: Annette Berry MRN: [id number] DOB: 11-09-1941  Admit date:     06/13/2023  Discharge date: 06/19/23  Discharge Physician: Annette Berry   PCP: Annette Mask, MD   Recommendations at discharge:  Continue oxygen weaning efforts over next week. May need 2L O2 with exertion (92% on room air at rest on day of discharge). Note addition of lasix 20mg  daily, coreg 6.25mg  BID for acute on chronic HFpEF. Monitor BP, BMP, volume status/weights. Recommend formal PFTs after resolution of current episode.  Discharge Diagnoses: Principal Problem:   Acute respiratory failure with hypoxia (HCC) Active Problems:   Tobacco abuse   HTN (hypertension)   CAP (community acquired pneumonia)   Respiratory failure with hypoxia Annette Berry)  Berry Course: Annette Berry is an 82 y.o. female with a history of CHF, HTN who presented 7/18 with cough and shortness of breath. CTA PE study was negative for PE but did show consolidation of the left greater than right base suspicious for aspiration versus infection. She was hypoxemic and admitted for pneumonia. After a complete course of antibiotics, her respiratory status has improved significantly. She is deconditioned and is currently awaiting disposition to SNF.   Assessment and Plan: Acute respiratory failure with hypoxia: Due to multifocal pneumonia, may have underlying undiagnosed COPD, CHF  - O2 weaned to 2L with exertion, continues to qualify, will plan to DC with supplemental oxygen. - SLP/MBS done, recommended dysphagia 3 diet   Multifocal pneumonia:  - SLP eval /MBS-> dysphagia 3 diet with thin liquids - Continue incentive spirometry. Has not required nebulized therapies. - Completed 5 days ceftriaxone, azithromycin.   Acute on chronic HFpEF, HTN: Mild exacerbation. BNP 265.4. Echo showed EF of 60 to 65%, G1 DD, normal RV systolic function - Received Lasix 20 mg IV daily x 3 doses. Transitioned  to PO lasix, continue Lasix 20 mg daily - Strict I's and O's and daily weights - Continue losartan, coreg   13 x 7 mm RUL pulmonary nodule: Incidental finding on CT scan - Recommended 3 month follow up CT or PET-CT    Severe sepsis due to pneumonia: Resolved. Met SIRS criteria with tachycardia, mild tachypnea, lactic acidosis, hypoxia, source likey PNA     AECOPD:  - Needs formal PFTs after resolution to confirm suspected Dx COPD.   Glaucoma: Quiescent.  - Continue gtt's  Consultants: None Procedures performed: None  Disposition: Skilled nursing facility Diet recommendation: Dysphagia 3, thin liquids DISCHARGE MEDICATION: Allergies as of 06/19/2023   No Known Allergies      Medication List     STOP taking these medications    diclofenac Sodium 1 % Gel Commonly known as: VOLTAREN   potassium chloride SA 20 MEQ tablet Commonly known as: KLOR-CON M   traMADol 50 MG tablet Commonly known as: ULTRAM       TAKE these medications    acetaminophen 650 MG CR tablet Commonly known as: TYLENOL Take 650-1,300 mg by mouth in the morning and at bedtime.   brimonidine 0.15 % ophthalmic solution Commonly known as: ALPHAGAN Place 1 drop into both eyes 2 (two) times daily.   carvedilol 6.25 MG tablet Commonly known as: COREG Take 1 tablet (6.25 mg total) by mouth 2 (two) times daily with a meal.   feeding supplement Liqd Take 237 mLs by mouth 2 (two) times daily between meals.   furosemide 20 MG tablet Commonly known as: LASIX Take 1 tablet (20 mg total) by mouth daily.  guaiFENesin-dextromethorphan 100-10 MG/5ML syrup Commonly known as: ROBITUSSIN DM Take 5 mLs by mouth every 4 (four) hours as needed for cough.   latanoprost 0.005 % ophthalmic solution Commonly known as: XALATAN Place 1 drop into both eyes at bedtime.   losartan 25 MG tablet Commonly known as: COZAAR Take 1 tablet (25 mg total) by mouth daily.   polyethylene glycol powder 17 GM/SCOOP  powder Commonly known as: MiraLax Take 255 g by mouth daily.   vitamin D3 25 MCG tablet Commonly known as: CHOLECALCIFEROL Take 2 tablets (2,000 Units total) by mouth 2 (two) times daily. What changed: when to take this               Durable Medical Equipment  (From admission, onward)           Start     Ordered   06/17/23 1632  For home use only DME oxygen  Once       Question Answer Comment  Length of Need 6 Months   Mode or (Route) Nasal cannula   Liters per Minute 2   Frequency Continuous (stationary and portable oxygen unit needed)   Oxygen conserving device Yes   Oxygen delivery system Gas      06/17/23 1632   06/17/23 1554  For home use only DME 3 n 1  Once       Comments: Patient needs 3:1 since patient will be confined to one room in the home.   06/17/23 1554            Follow-up Information     [facility] Follow up.   Specialty: Home Health Services Why: [facility] will be providing home health services.  They will call you to set up services in the next 24-48 hours. Contact information: [address] [place] [address] [phone]         Annette Mask, MD Follow up.   Specialty: Family Medicine Contact information: [address] [place] [address] [phone]                Discharge Exam: Ceasar Mons Weights   06/14/23 0506 06/15/23 0640 06/16/23 0610  Weight: 69 kg 66.4 kg 66.1 kg  BP (!) 131/55 (BP Location: Left Arm)   Pulse 84   Temp 98.7 F (37.1 C)   Resp 18   Ht 5\' 4"  (1.626 m)   Wt 66.1 kg   SpO2 92%   BMI 25.01 kg/m   Annette Berry, chronically ill-appearing, HOH, but quite pleasant female in no distress. Interested in [employer].  Clear, nonlabored laying supine at rest on room air RRR No edema  Condition at discharge: stable  The results of significant diagnostics from this hospitalization (including imaging, microbiology, ancillary and laboratory) are listed below for  reference.   Imaging Studies: DG Swallowing Func-Speech Pathology  Result Date: 06/17/2023 Table formatting from the original result was not included. Modified Barium Swallow Study Patient Details Name: Annette Berry MRN: [id number] Date of Birth: November 28, 1940 Today's Date: 06/17/2023 HPI/PMH: HPI: Iasha Mccalister is an 82 y.o. female who presented with cough and shortness of breath.   Patient reported she has been experiencing some cough with associated shortness of breath for the past week.  Denies any known sick contacts. CXR 7/18: "Persistent asymmetric airspace disease within the left base compatible with pneumonia." Chest CT 7/18: "Consolidative opacities in dependent portions of the left-greater-than-right lower lobes, suspicious for aspiration or infection."  Pt with hx CHF, hypertension, hard  of hearing. Clinical Impression: Clinical Impression: Pt presents with functional oropharyngeal swallow with delayed swallow initiation at the vallecula with solids and pyriforms with liquids.  There was trace, transient penetration of thin liquid before the swallow, above the level of the vocal folds during pill simulation, which fully cleared during swallow.  There was no penetration of thin liquids in isolation or of any other consistencies trialed today.  Pt noted to talk prior to swallow with full bolus of liquid in pharynx, pooled in the vallecula and pyriform sinuses without any penetration.  During pill simulation there was esopheal retention of tablet and mild backflow with in the esophagus.  There was progression of tablet with liquid wash, and full clearance with puree bolus.  Consider giving meds whole with puree, or at least following medication administration with bites of puree. Pt has no further ST needs. SLP will sign off. Recommend continuing current mechanical soft diet with thin liquids, where pt does not have dentures present this admission, but pt is safe to advance to a regular  texture diet if desired. Factors that may increase risk of adverse event in presence of aspiration Rubye Oaks & Clearance Coots 2021): Factors that may increase risk of adverse event in presence of aspiration Rubye Oaks & Clearance Coots 2021): Respiratory or GI disease Recommendations/Plan: Swallowing Evaluation Recommendations Swallowing Evaluation Recommendations Recommendations: PO diet PO Diet Recommendation: Dysphagia 3 (Mechanical soft); Thin liquids (Level 0) (may advance to regular if desired) Liquid Administration via: Cup; Straw Medication Administration: Whole meds with puree (or whole with liquid giving bites of puree to follow) Swallowing strategies  : Slow rate; Small bites/sips Postural changes: Position pt fully upright for meals; Stay upright 30-60 min after meals Oral care recommendations: Oral care BID (2x/day) Treatment Plan Treatment Plan Treatment recommendations: No treatment recommended at this time Follow-up recommendations: No SLP follow up Functional status assessment: Patient has not had a recent decline in their functional status. Treatment frequency: -- (N/A) Interventions: -- (N/A) Recommendations Recommendations for follow up therapy are one component of a multi-disciplinary discharge planning process, led by the attending physician.  Recommendations may be updated based on patient status, additional functional criteria and insurance authorization. Assessment: Orofacial Exam: Orofacial Exam Oral Cavity: Oral Hygiene: WFL Oral Cavity - Dentition: Edentulous; Dentures, not available Orofacial Anatomy: WFL Oral Motor/Sensory Function: -- (See BSE) Anatomy: Anatomy: WFL Boluses Administered: Boluses Administered Boluses Administered: Thin liquids (Level 0); Mildly thick liquids (Level 2, nectar thick); Moderately thick liquids (Level 3, honey thick); Puree; Solid  Oral Impairment Domain: Oral Impairment Domain Lip Closure: No labial escape Tongue control during bolus hold: Posterior escape of greater than  half of bolus Bolus preparation/mastication: Slow prolonged chewing/mashing with complete recollection Bolus transport/lingual motion: Brisk tongue motion Oral residue: Complete oral clearance Location of oral residue : N/A Initiation of pharyngeal swallow : Pyriform sinuses  Pharyngeal Impairment Domain: Pharyngeal Impairment Domain Soft palate elevation: No bolus between soft palate (SP)/pharyngeal wall (PW) Laryngeal elevation: Complete superior movement of thyroid cartilage with complete approximation of arytenoids to epiglottic petiole Anterior hyoid excursion: Complete anterior movement Epiglottic movement: Complete inversion Laryngeal vestibule closure: Complete, no air/contrast in laryngeal vestibule Pharyngeal stripping wave : Present - complete Pharyngeal contraction (A/P view only): N/A Pharyngoesophageal segment opening: Complete distension and complete duration, no obstruction of flow Tongue base retraction: No contrast between tongue base and posterior pharyngeal wall (PPW) Pharyngeal residue: Trace residue within or on pharyngeal structures Location of pharyngeal residue: Pyriform sinuses  Esophageal Impairment Domain: Esophageal Impairment Domain Esophageal  clearance upright position: Esophageal retention; Esophageal retention with retrograde flow below pharyngoesophageal segment (PES) Pill: Pill Consistency administered: Thin liquids (Level 0) Penetration/Aspiration Scale Score: Penetration/Aspiration Scale Score 1.  Material does not enter airway: Mildly thick liquids (Level 2, nectar thick); Moderately thick liquids (Level 3, honey thick); Solid; Puree; Pill; Thin liquids (Level 0) 2.  Material enters airway, remains ABOVE vocal cords then ejected out: Thin liquids (Level 0) (will pill simulation) Compensatory Strategies: Compensatory Strategies Compensatory strategies: No   General Information: Caregiver present: No  Diet Prior to this Study: Regular; Thin liquids (Level 0)   No data recorded  No  data recorded  Supplemental O2: High flow nasal cannula   History of Recent Intubation: No  Behavior/Cognition: Alert; Distractible No data recorded Baseline vocal quality/speech: Normal No data recorded No data recorded Exam Limitations: Limited visibility (intermittent decreased visibility of larynx 2/2 shoulder position) Goal Planning: Prognosis for improved oropharyngeal function: -- (N/A) No data recorded No data recorded Patient/Family Stated Goal: To go home Consulted and agree with results and recommendations: Patient; Nurse Pain: Pain Assessment Pain Assessment: Faces Faces Pain Scale: 0 End of Session: Start Time:SLP Start Time (ACUTE ONLY): 0915 Stop Time: SLP Stop Time (ACUTE ONLY): 0926 Time Calculation:SLP Time Calculation (min) (ACUTE ONLY): 11 min Charges: SLP Evaluations $ SLP Speech Visit: 1 Visit SLP Evaluations $MBS Swallow: 1 Procedure SLP visit diagnosis: SLP Visit Diagnosis: Dysphagia, unspecified (R13.10) Past Medical History: Past Medical History: Diagnosis Date  Arthritis   CHF (congestive heart failure) (HCC)   HOH (hard of hearing)  Past Surgical History: Past Surgical History: Procedure Laterality Date  INTRAMEDULLARY (IM) NAIL INTERTROCHANTERIC Left 09/07/2019  INTRAMEDULLARY (IM) NAIL INTERTROCHANTRIC (Left Hip)  INTRAMEDULLARY (IM) NAIL INTERTROCHANTERIC Left 09/07/2019  Procedure: INTRAMEDULLARY (IM) NAIL INTERTROCHANTRIC;  Surgeon: Roby Lofts, MD;  Location: [facility] OR;  Service: Orthopedics;  Laterality: Left; Kerrie Pleasure, MA, CCC-SLP Acute Rehabilitation Services Office: [phone] 06/17/2023, 10:30 AM  ECHOCARDIOGRAM COMPLETE  Result Date: 06/14/2023    ECHOCARDIOGRAM REPORT   Patient Name:   TUANA HOHEISEL Date of Exam: 06/14/2023 Medical Rec #:  [id number]              Height:       64.0 in Accession #:    [id number]             Weight:       152.1 lb Date of Birth:  October 05, 1941             BSA:          1.742 m Patient Age:    81 years               BP:            118/60 mmHg Patient Gender: F                      HR:           91 bpm. Exam Location:  Inpatient Procedure: 2D Echo, Cardiac Doppler and Color Doppler Indications:    CHF-Acute Diastolic I50.31  History:        Patient has no prior history of Echocardiogram examinations.                 CHF; Risk Factors:Hypertension and Current Smoker.  Sonographer:    Dondra Prader RVT RCS Referring Phys: [id number] RIPUDEEP K RAI  Sonographer Comments: Image acquisition challenging due to respiratory motion. IMPRESSIONS  1. Left  ventricular ejection fraction, by estimation, is 60 to 65%. The left ventricle has normal function. The left ventricle has no regional wall motion abnormalities. Left ventricular diastolic parameters are consistent with Grade I diastolic dysfunction (impaired relaxation).  2. Right ventricular systolic function is normal. The right ventricular size is normal.  3. The mitral valve is normal in structure. No evidence of mitral valve regurgitation. No evidence of mitral stenosis.  4. The aortic valve is tricuspid. There is moderate calcification of the aortic valve. Aortic valve regurgitation is not visualized. Aortic valve sclerosis/calcification is present, without any evidence of aortic stenosis.  5. The inferior vena cava is normal in size with greater than 50% respiratory variability, suggesting right atrial pressure of 3 mmHg. FINDINGS  Left Ventricle: Left ventricular ejection fraction, by estimation, is 60 to 65%. The left ventricle has normal function. The left ventricle has no regional wall motion abnormalities. The left ventricular internal cavity size was normal in size. There is  no left ventricular hypertrophy. Left ventricular diastolic parameters are consistent with Grade I diastolic dysfunction (impaired relaxation). Right Ventricle: The right ventricular size is normal. No increase in right ventricular wall thickness. Right ventricular systolic function is normal. Left Atrium: Left atrial size  was normal in size. Right Atrium: Right atrial size was normal in size. Pericardium: There is no evidence of pericardial effusion. Mitral Valve: The mitral valve is normal in structure. No evidence of mitral valve regurgitation. No evidence of mitral valve stenosis. Tricuspid Valve: The tricuspid valve is normal in structure. Tricuspid valve regurgitation is trivial. No evidence of tricuspid stenosis. Aortic Valve: The aortic valve is tricuspid. There is moderate calcification of the aortic valve. Aortic valve regurgitation is not visualized. Aortic valve sclerosis/calcification is present, without any evidence of aortic stenosis. Aortic valve mean gradient measures 6.5 mmHg. Aortic valve peak gradient measures 12.0 mmHg. Aortic valve area, by VTI measures 1.15 cm. Pulmonic Valve: The pulmonic valve was not well visualized. Pulmonic valve regurgitation is not visualized. No evidence of pulmonic stenosis. Aorta: The aortic root is normal in size and structure and the ascending aorta was not well visualized. Venous: The inferior vena cava is normal in size with greater than 50% respiratory variability, suggesting right atrial pressure of 3 mmHg. IAS/Shunts: No atrial level shunt detected by color flow Doppler.  LEFT VENTRICLE PLAX 2D LVIDd:         4.10 cm   Diastology LVIDs:         2.80 cm   LV e' medial:    8.49 cm/s LV PW:         0.80 cm   LV E/e' medial:  10.4 LV IVS:        0.90 cm   LV e' lateral:   9.25 cm/s LVOT diam:     1.70 cm   LV E/e' lateral: 9.5 LV SV:         35 LV SV Index:   20 LVOT Area:     2.27 cm  RIGHT VENTRICLE             IVC RV Basal diam:  3.80 cm     IVC diam: 1.60 cm RV S prime:     11.70 cm/s TAPSE (M-mode): 1.9 cm LEFT ATRIUM             Index        RIGHT ATRIUM           Index LA Vol (A2C):   28.3 ml  16.25 ml/m  RA Area:     11.30 cm LA Vol (A4C):   24.3 ml 13.92 ml/m  RA Volume:   26.80 ml  15.39 ml/m LA Biplane Vol: 31.1 ml 17.86 ml/m  AORTIC VALVE                      PULMONIC VALVE AV Area (Vmax):    1.08 cm      PV Vmax:       1.43 m/s AV Area (Vmean):   1.10 cm      PV Peak grad:  8.2 mmHg AV Area (VTI):     1.15 cm AV Vmax:           173.00 cm/s AV Vmean:          113.000 cm/s AV VTI:            0.304 m AV Peak Grad:      12.0 mmHg AV Mean Grad:      6.5 mmHg LVOT Vmax:         82.30 cm/s LVOT Vmean:        55.000 cm/s LVOT VTI:          0.154 m LVOT/AV VTI ratio: 0.51  AORTA Ao Root diam: 3.30 cm MITRAL VALVE MV Area (PHT): 4.21 cm    SHUNTS MV Decel Time: 180 msec    Systemic VTI:  0.15 m MV E velocity: 88.00 cm/s  Systemic Diam: 1.70 cm MV A velocity: 94.30 cm/s MV E/A ratio:  0.93 Arvilla Meres MD Electronically signed by Arvilla Meres MD Signature Date/Time: 06/14/2023/3:13:32 PM    Final    CT Angio Chest PE W/Cm &/Or Wo Cm  Result Date: 06/13/2023 CLINICAL DATA:  Pulmonary embolism (PE) suspected, low to intermediate prob, positive D-dimer Pulmonary embolism (PE) suspected, high prob. EXAM: CT ANGIOGRAPHY CHEST WITH CONTRAST TECHNIQUE: Multidetector CT imaging of the chest was performed using the standard protocol during bolus administration of intravenous contrast. Multiplanar CT image reconstructions and MIPs were obtained to evaluate the vascular anatomy. RADIATION DOSE REDUCTION: This exam was performed according to the departmental dose-optimization program which includes automated exposure control, adjustment of the mA and/or kV according to patient size and/or use of iterative reconstruction technique. CONTRAST:  75mL OMNIPAQUE IOHEXOL 350 MG/ML SOLN COMPARISON:  Chest radiographs 06/13/2023. FINDINGS: Cardiovascular: Satisfactory opacification of the pulmonary arteries to the segmental level. No evidence of pulmonary embolism. Extensive coronary artery calcifications. Atherosclerotic calcifications of the thoracic aorta. Mediastinum/Nodes: No enlarged mediastinal, hilar, or axillary lymph nodes. Thyroid gland, trachea, and esophagus demonstrate no  significant findings. Lungs/Pleura: Moderate centrilobular emphysema. 13 x 7 mm solid nodule in the right upper lobe (axial image 36 series 7). Consolidative opacities in dependent portions of the left-greater-than-right lower lobes, suspicious for aspiration or infection. No pleural effusion or pneumothorax. Upper Abdomen: No acute abnormality. Musculoskeletal: No chest wall abnormality. No acute or significant osseous findings. Review of the MIP images confirms the above findings. IMPRESSION: 1. No evidence of pulmonary embolism. 2. Consolidative opacities in dependent portions of the left-greater-than-right lower lobes, suspicious for aspiration or infection. 3. 13 x 7 mm solid nodule in the right upper lobe. Per Fleischner Society Guidelines, consider a non-contrast Chest CT at 3 months, a PET/CT, or tissue sampling. These guidelines do not apply to immunocompromised patients and patients with cancer. Follow up in patients with significant comorbidities as clinically warranted. Reference: Radiology. 2017; 284(1):228-43. Aortic Atherosclerosis (ICD10-I70.0) and Emphysema (ICD10-J43.9). Electronically Signed  By: Orvan Falconer M.D.   On: 06/13/2023 14:10   DG Chest Portable 1 View  Result Date: 06/13/2023 CLINICAL DATA:  Cough. EXAM: PORTABLE CHEST 1 VIEW COMPARISON:  06/11/2023 FINDINGS: Normal heart size and mediastinal contours. No signs of pleural effusion or edema. Asymmetric airspace disease is identified within the left lung base which appears similar to the previous exam. Right lung appears clear. Remote fracture deformity of the proximal right humerus. IMPRESSION: Persistent asymmetric airspace disease within the left base compatible with pneumonia. Electronically Signed   By: Signa Kell M.D.   On: 06/13/2023 11:25    Microbiology: Results for orders placed or performed during the [facility] encounter of 06/13/23  SARS Coronavirus 2 by RT PCR ([facility] order, performed in [facility]) *cepheid single result test* Anterior Nasal Swab     Status: None   Collection Time: 06/13/23 10:53 AM   Specimen: Anterior Nasal Swab  Result Value Ref Range Status   SARS Coronavirus 2 by RT PCR NEGATIVE NEGATIVE Final    Comment: Performed at [facility], [address] [place], [place] [address]  Blood culture (routine x 2)     Status: None   Collection Time: 06/13/23  2:45 PM   Specimen: BLOOD RIGHT WRIST  Result Value Ref Range Status   Specimen Description BLOOD RIGHT WRIST  Final   Special Requests   Final    BOTTLES DRAWN AEROBIC AND ANAEROBIC Blood Culture results may not be optimal due to an inadequate volume of blood received in culture bottles   Culture   Final    NO GROWTH 5 DAYS Performed at [facility], [address] [place], [place] [address]    Report Status 06/18/2023 FINAL  Final  Blood culture (routine x 2)     Status: None   Collection Time: 06/13/23  2:50 PM   Specimen: BLOOD RIGHT ARM  Result Value Ref Range Status   Specimen Description BLOOD RIGHT ARM  Final   Special Requests   Final    BOTTLES DRAWN AEROBIC AND ANAEROBIC Blood Culture results may not be optimal due to an inadequate volume of blood received in culture bottles   Culture   Final    NO GROWTH 5 DAYS Performed at [facility], [address] [place], [place] [address]    Report Status 06/18/2023 FINAL  Final    Labs: CBC: Recent Labs  Lab 06/14/23 0314 06/15/23 0448 06/16/23 0419 06/17/23 0657 06/18/23 0834  WBC 22.8* 17.7* 14.9* 14.0* 12.0*  HGB 13.0 13.7 13.9 14.2 15.2*  HCT 38.2 41.1 43.1 43.4 46.1*  MCV 87.8 88.2 87.8 88.0 88.1  PLT 363 361 396 414* 413*   Basic Metabolic Panel: Recent Labs  Lab 06/13/23 1053 06/14/23 0314 06/15/23 0448 06/16/23 0419 06/17/23 0657 06/18/23 0834  NA 137 139 137 137 137 136  K 3.1* 3.8 3.4* 3.9 3.7 3.8  CL 99 103 102 101 102 98  CO2 26 26 25 25 28 27   GLUCOSE 141* 158* 111* 100* 109* 106*  BUN 12 13 17  13 15 15   CREATININE 0.72 0.65 0.58 0.63 0.72 0.71  CALCIUM 9.4 8.9 8.6* 8.9 8.8* 9.0  MG 1.9  --   --   --   --   --    Liver Function Tests: Recent Labs  Lab 06/13/23 1053 06/14/23 0314  AST 28 20  ALT 15 15  ALKPHOS 80 65  BILITOT 1.1 0.4  PROT 7.1 6.2*  ALBUMIN 2.9* 2.3*   CBG: Recent Labs  Lab 06/17/23 0944 06/17/23 1149  GLUCAP 160* 128*    Discharge time spent: greater than 30 minutes.  Signed: Tyrone Nine, MD [facility] 06/19/2023

## 2023-06-20 DIAGNOSIS — J9601 Acute respiratory failure with hypoxia: Secondary | ICD-10-CM | POA: Diagnosis not present

## 2023-06-20 LAB — CREATININE, SERUM
Creatinine, Ser: 0.84 mg/dL (ref 0.44–1.00)
GFR, Estimated: >60 mL/min (ref >60)

## 2023-06-20 NOTE — TOC Progression Note (Signed)
Transition of Care [facility]) - Progression Note    Patient Details  Name: Annette Berry MRN: [id number] Date of Birth: 23-Nov-1941  Transition of Care [facility]) CM/SW Contact  Shanon Becvar A [place], [place] Phone Number: 06/20/2023, 11:10 AM  Clinical Narrative:     Patient will DC to: [facility] [place]  Anticipated DC date: 06/20/23  Family notified: [facility]  Transport by: Theodoro Grist ID [id number] Reference ID [id number]   Per MD patient ready for DC to [facility] [place]. RN, patient, patient's family, and facility notified of DC. Discharge Summary and FL2 sent to facility. RN to call report prior to discharge (Room 202, [phone]). DC packet on chart. Ambulance transport requested for patient.     CSW will sign off for now as social work intervention is no longer needed. Please consult [place] again if new needs arise.   Expected Discharge Plan: Skilled Nursing Facility Barriers to Discharge: Continued Medical Work up  Expected Discharge Plan and Services   Discharge Planning Services: CM Consult Post Acute Care Choice: Durable Medical Equipment Living arrangements for the past 2 months: Single Family Home Expected Discharge Date: 06/19/23                                     Social Determinants of Health (SDOH) Interventions SDOH Screenings   Food Insecurity: No Food Insecurity (06/13/2023)  Housing: Low Risk  (06/13/2023)  Transportation Needs: No Transportation Needs (06/13/2023)  Utilities: Not At Risk (06/13/2023)  Tobacco Use: High Risk (06/13/2023)    Readmission Risk Interventions     No data to display

## 2023-06-20 NOTE — TOC Progression Note (Signed)
Transition of Care [facility]) - Progression Note    Patient Details  Name: Annette Berry MRN: [id number] Date of Birth: 08/23/1941  Transition of Care [facility]) CM/SW Contact  Janae Bridgeman, RN Phone Number: 06/20/2023, 8:52 AM  Clinical Narrative:    CM continuing to follow the patient to support SNF admission to [facility] in [place].  Kandee Keen, RNCM with [facility] called and states they are unable to accept patient for [facility] services after SNF since the patient's PCP does not support discharge orders for home health resources in the community.   Expected Discharge Plan: Skilled Nursing Facility Barriers to Discharge: Continued Medical Work up  Expected Discharge Plan and Services   Discharge Planning Services: CM Consult Post Acute Care Choice: Durable Medical Equipment Living arrangements for the past 2 months: Single Family Home Expected Discharge Date: 06/19/23                                     Social Determinants of Health (SDOH) Interventions SDOH Screenings   Food Insecurity: No Food Insecurity (06/13/2023)  Housing: Low Risk  (06/13/2023)  Transportation Needs: No Transportation Needs (06/13/2023)  Utilities: Not At Risk (06/13/2023)  Tobacco Use: High Risk (06/13/2023)    Readmission Risk Interventions     No data to display

## 2023-06-20 NOTE — Progress Notes (Signed)
PT Cancellation Note  Patient Details Name: Annette Berry MRN: [id number] DOB: Dec 15, 1940   Cancelled Treatment:    Reason Eval/Treat Not Completed: (P) Patient declined, no reason specified, pt declining all mobility requesting to rest. Will check back as schedule allows to continue with PT POC.  Lenora Boys. PTA Acute Rehabilitation Services Office: [phone]    Catalina Antigua 06/20/2023, 3:49 PM

## 2023-06-20 NOTE — Discharge Summary (Signed)
Physician Discharge Summary   Patient: Annette Berry MRN: [id number] DOB: [birth year]  Admit date:     06/13/2023  Discharge date: 06/20/23  Discharge Physician: Hughie Closs   PCP: Kaleen Mask, MD   Recommendations at discharge:  Continue oxygen weaning efforts over next week. May need 2L O2 with exertion (92% on room air at rest on day of discharge). Note addition of lasix 20mg  daily, coreg 6.25mg  BID for acute on chronic HFpEF. Monitor BP, BMP, volume status/weights. Recommend formal PFTs after resolution of current episode.  Discharge Diagnoses: Principal Problem:   Acute respiratory failure with hypoxia (HCC) Active Problems:   Tobacco abuse   HTN (hypertension)   CAP (community acquired pneumonia)   Respiratory failure with hypoxia [facility])  Hospital Course: Annette Berry is an 82 y.o. female with a history of CHF, HTN who presented 7/18 with cough and shortness of breath. CTA PE study was negative for PE but did show consolidation of the left greater than right base suspicious for aspiration versus infection. She was hypoxemic and admitted for pneumonia. After a complete course of antibiotics, her respiratory status has improved significantly. She is deconditioned and is currently awaiting disposition to SNF.   Assessment and Plan: Acute respiratory failure with hypoxia: Due to multifocal pneumonia, may have underlying undiagnosed COPD, CHF  - O2 weaned to 2L with exertion, continues to qualify, will plan to DC with supplemental oxygen. - SLP/MBS done, recommended dysphagia 3 diet   Multifocal pneumonia:  - SLP eval /MBS-> dysphagia 3 diet with thin liquids - Continue incentive spirometry. Has not required nebulized therapies. - Completed 5 days ceftriaxone, azithromycin.   Acute on chronic HFpEF, HTN: Mild exacerbation. BNP 265.4. Echo showed EF of 60 to 65%, G1 DD, normal RV systolic function - Received Lasix 20 mg IV daily x 3 doses. Transitioned  to PO lasix, continue Lasix 20 mg daily - Strict I's and O's and daily weights - Continue losartan, coreg   13 x 7 mm RUL pulmonary nodule: Incidental finding on CT scan - Recommended 3 month follow up CT or PET-CT    Severe sepsis due to pneumonia: Resolved. Met SIRS criteria with tachycardia, mild tachypnea, lactic acidosis, hypoxia, source likey PNA     AECOPD:  - Needs formal PFTs after resolution to confirm suspected Dx COPD.   Glaucoma: Quiescent.  - Continue gtt's  Addendum 06/19/2023, 19 a.m.: Patient was supposed to be discharged yesterday to SNF, pending insurance authorization but authorization could not be achieved so patient ended up staying overnight.  Patient seen and examined this morning.  She had no complaints.  No shortness of breath.  Lungs clear to auscultation on my examination.  She is as stable as she was yesterday and now we have received insurance authorization and she will be discharged to SNF in stable condition today.  Consultants: None Procedures performed: None  Disposition: Skilled nursing facility Diet recommendation: Dysphagia 3, thin liquids DISCHARGE MEDICATION: Allergies as of 06/20/2023   No Known Allergies      Medication List     STOP taking these medications    diclofenac Sodium 1 % Gel Commonly known as: VOLTAREN   potassium chloride SA 20 MEQ tablet Commonly known as: KLOR-CON M   traMADol 50 MG tablet Commonly known as: ULTRAM       TAKE these medications    acetaminophen 650 MG CR tablet Commonly known as: TYLENOL Take 650-1,300 mg by mouth in the morning and at bedtime.  brimonidine 0.15 % ophthalmic solution Commonly known as: ALPHAGAN Place 1 drop into both eyes 2 (two) times daily.   carvedilol 6.25 MG tablet Commonly known as: COREG Take 1 tablet (6.25 mg total) by mouth 2 (two) times daily with a meal.   feeding supplement Liqd Take 237 mLs by mouth 2 (two) times daily between meals.   furosemide 20 MG  tablet Commonly known as: LASIX Take 1 tablet (20 mg total) by mouth daily.   guaiFENesin-dextromethorphan 100-10 MG/5ML syrup Commonly known as: ROBITUSSIN DM Take 5 mLs by mouth every 4 (four) hours as needed for cough.   latanoprost 0.005 % ophthalmic solution Commonly known as: XALATAN Place 1 drop into both eyes at bedtime.   losartan 25 MG tablet Commonly known as: COZAAR Take 1 tablet (25 mg total) by mouth daily.   polyethylene glycol powder 17 GM/SCOOP powder Commonly known as: MiraLax Take 255 g by mouth daily.   vitamin D3 25 MCG tablet Commonly known as: CHOLECALCIFEROL Take 2 tablets (2,000 Units total) by mouth 2 (two) times daily. What changed: when to take this               Durable Medical Equipment  (From admission, onward)           Start     Ordered   06/17/23 1632  For home use only DME oxygen  Once       Question Answer Comment  Length of Need 6 Months   Mode or (Route) Nasal cannula   Liters per Minute 2   Frequency Continuous (stationary and portable oxygen unit needed)   Oxygen conserving device Yes   Oxygen delivery system Gas      06/17/23 1632   06/17/23 1554  For home use only DME 3 n 1  Once       Comments: Patient needs 3:1 since patient will be confined to one room in the home.   06/17/23 1554            Follow-up Information     Kaleen Mask, MD Follow up.   Specialty: Family Medicine Contact information: [address] [place] [address] [phone]                Discharge Exam: Ceasar Mons Weights   06/14/23 0506 06/15/23 0640 06/16/23 0610  Weight: 69 kg 66.4 kg 66.1 kg  BP (!) 146/81 (BP Location: Left Arm)   Pulse 80   Temp 97.9 F (36.6 C)   Resp 18   Ht 5\' 4"  (1.626 m)   Wt 66.1 kg   SpO2 95%   BMI 25.01 kg/m   General exam: Appears calm and comfortable  Respiratory system: Clear to auscultation. Respiratory effort normal. Cardiovascular system: S1 & S2 heard, RRR. No JVD,  murmurs, rubs, gallops or clicks. No pedal edema. Gastrointestinal system: Abdomen is nondistended, soft and nontender. No organomegaly or masses felt. Normal bowel sounds heard. Central nervous system: Alert and oriented. No focal neurological deficits. Extremities: Symmetric 5 x 5 power. Skin: No rashes, lesions or ulcers.  Psychiatry: Judgement and insight appear normal. Mood & affect appropriate.    Condition at discharge: stable  The results of significant diagnostics from this hospitalization (including imaging, microbiology, ancillary and laboratory) are listed below for reference.   Imaging Studies: DG Chest 2 View  Result Date: 06/19/2023 CLINICAL DATA:  Cough and congestion for 2 weeks EXAM: CHEST - 2 VIEW COMPARISON:  01/11/2021 FINDINGS: Frontal and lateral views of  the chest demonstrate an unremarkable cardiac silhouette. Patchy left lower lobe airspace disease consistent with aspiration or pneumonia. No effusion or pneumothorax. Background emphysema. No acute bony abnormalities. IMPRESSION: 1. Patchy left lower lobe airspace disease consistent with aspiration or pneumonia. 2. Emphysema. Electronically Signed   By: Sharlet Salina M.D.   On: 06/19/2023 12:33   DG Swallowing Func-Speech Pathology  Result Date: 06/17/2023 Table formatting from the original result was not included. Modified Barium Swallow Study Patient Details Name: Annette Berry MRN: [id number] Date of Birth: Dec 24, 1940 Today's Date: 06/17/2023 HPI/PMH: HPI: Shaletta Hinostroza is an 82 y.o. female who presented with cough and shortness of breath.   Patient reported she has been experiencing some cough with associated shortness of breath for the past week.  Denies any known sick contacts. CXR 7/18: "Persistent asymmetric airspace disease within the left base compatible with pneumonia." Chest CT 7/18: "Consolidative opacities in dependent portions of the left-greater-than-right lower lobes, suspicious for  aspiration or infection."  Pt with hx CHF, hypertension, hard of hearing. Clinical Impression: Clinical Impression: Pt presents with functional oropharyngeal swallow with delayed swallow initiation at the vallecula with solids and pyriforms with liquids.  There was trace, transient penetration of thin liquid before the swallow, above the level of the vocal folds during pill simulation, which fully cleared during swallow.  There was no penetration of thin liquids in isolation or of any other consistencies trialed today.  Pt noted to talk prior to swallow with full bolus of liquid in pharynx, pooled in the vallecula and pyriform sinuses without any penetration.  During pill simulation there was esopheal retention of tablet and mild backflow with in the esophagus.  There was progression of tablet with liquid wash, and full clearance with puree bolus.  Consider giving meds whole with puree, or at least following medication administration with bites of puree. Pt has no further ST needs. SLP will sign off. Recommend continuing current mechanical soft diet with thin liquids, where pt does not have dentures present this admission, but pt is safe to advance to a regular texture diet if desired. Factors that may increase risk of adverse event in presence of aspiration Rubye Oaks & Clearance Coots 2021): Factors that may increase risk of adverse event in presence of aspiration Rubye Oaks & Clearance Coots 2021): Respiratory or GI disease Recommendations/Plan: Swallowing Evaluation Recommendations Swallowing Evaluation Recommendations Recommendations: PO diet PO Diet Recommendation: Dysphagia 3 (Mechanical soft); Thin liquids (Level 0) (may advance to regular if desired) Liquid Administration via: Cup; Straw Medication Administration: Whole meds with puree (or whole with liquid giving bites of puree to follow) Swallowing strategies  : Slow rate; Small bites/sips Postural changes: Position pt fully upright for meals; Stay upright 30-60 min after  meals Oral care recommendations: Oral care BID (2x/day) Treatment Plan Treatment Plan Treatment recommendations: No treatment recommended at this time Follow-up recommendations: No SLP follow up Functional status assessment: Patient has not had a recent decline in their functional status. Treatment frequency: -- (N/A) Interventions: -- (N/A) Recommendations Recommendations for follow up therapy are one component of a multi-disciplinary discharge planning process, led by the attending physician.  Recommendations may be updated based on patient status, additional functional criteria and insurance authorization. Assessment: Orofacial Exam: Orofacial Exam Oral Cavity: Oral Hygiene: WFL Oral Cavity - Dentition: Edentulous; Dentures, not available Orofacial Anatomy: WFL Oral Motor/Sensory Function: -- (See BSE) Anatomy: Anatomy: WFL Boluses Administered: Boluses Administered Boluses Administered: Thin liquids (Level 0); Mildly thick liquids (Level 2, nectar thick); Moderately thick liquids (Level 3,  honey thick); Puree; Solid  Oral Impairment Domain: Oral Impairment Domain Lip Closure: No labial escape Tongue control during bolus hold: Posterior escape of greater than half of bolus Bolus preparation/mastication: Slow prolonged chewing/mashing with complete recollection Bolus transport/lingual motion: Brisk tongue motion Oral residue: Complete oral clearance Location of oral residue : N/A Initiation of pharyngeal swallow : Pyriform sinuses  Pharyngeal Impairment Domain: Pharyngeal Impairment Domain Soft palate elevation: No bolus between soft palate (SP)/pharyngeal wall (PW) Laryngeal elevation: Complete superior movement of thyroid cartilage with complete approximation of arytenoids to epiglottic petiole Anterior hyoid excursion: Complete anterior movement Epiglottic movement: Complete inversion Laryngeal vestibule closure: Complete, no air/contrast in laryngeal vestibule Pharyngeal stripping wave : Present - complete  Pharyngeal contraction (A/P view only): N/A Pharyngoesophageal segment opening: Complete distension and complete duration, no obstruction of flow Tongue base retraction: No contrast between tongue base and posterior pharyngeal wall (PPW) Pharyngeal residue: Trace residue within or on pharyngeal structures Location of pharyngeal residue: Pyriform sinuses  Esophageal Impairment Domain: Esophageal Impairment Domain Esophageal clearance upright position: Esophageal retention; Esophageal retention with retrograde flow below pharyngoesophageal segment (PES) Pill: Pill Consistency administered: Thin liquids (Level 0) Penetration/Aspiration Scale Score: Penetration/Aspiration Scale Score 1.  Material does not enter airway: Mildly thick liquids (Level 2, nectar thick); Moderately thick liquids (Level 3, honey thick); Solid; Puree; Pill; Thin liquids (Level 0) 2.  Material enters airway, remains ABOVE vocal cords then ejected out: Thin liquids (Level 0) (will pill simulation) Compensatory Strategies: Compensatory Strategies Compensatory strategies: No   General Information: Caregiver present: No  Diet Prior to this Study: Regular; Thin liquids (Level 0)   No data recorded  No data recorded  Supplemental O2: High flow nasal cannula   History of Recent Intubation: No  Behavior/Cognition: Alert; Distractible No data recorded Baseline vocal quality/speech: Normal No data recorded No data recorded Exam Limitations: Limited visibility (intermittent decreased visibility of larynx 2/2 shoulder position) Goal Planning: Prognosis for improved oropharyngeal function: -- (N/A) No data recorded No data recorded Patient/Family Stated Goal: To go home Consulted and agree with results and recommendations: Patient; Nurse Pain: Pain Assessment Pain Assessment: Faces Faces Pain Scale: 0 End of Session: Start Time:SLP Start Time (ACUTE ONLY): 0915 Stop Time: SLP Stop Time (ACUTE ONLY): 0926 Time Calculation:SLP Time Calculation (min) (ACUTE  ONLY): 11 min Charges: SLP Evaluations $ SLP Speech Visit: 1 Visit SLP Evaluations $MBS Swallow: 1 Procedure SLP visit diagnosis: SLP Visit Diagnosis: Dysphagia, unspecified (R13.10) Past Medical History: Past Medical History: Diagnosis Date  Arthritis   CHF (congestive heart failure) (HCC)   HOH (hard of hearing)  Past Surgical History: Past Surgical History: Procedure Laterality Date  INTRAMEDULLARY (IM) NAIL INTERTROCHANTERIC Left 09/07/2019  INTRAMEDULLARY (IM) NAIL INTERTROCHANTRIC (Left Hip)  INTRAMEDULLARY (IM) NAIL INTERTROCHANTERIC Left 09/07/2019  Procedure: INTRAMEDULLARY (IM) NAIL INTERTROCHANTRIC;  Surgeon: Roby Lofts, MD;  Location: [facility] OR;  Service: Orthopedics;  Laterality: Left; Kerrie Pleasure, MA, CCC-SLP Acute Rehabilitation Services Office: [phone] 06/17/2023, 10:30 AM  ECHOCARDIOGRAM COMPLETE  Result Date: 06/14/2023    ECHOCARDIOGRAM REPORT   Patient Name:   Annette Berry Date of Exam: 06/14/2023 Medical Rec #:  [id number]              Height:       64.0 in Accession #:    [id number]             Weight:       152.1 lb Date of Birth:  Sep 20, 1941  BSA:          1.742 m Patient Age:    81 years               BP:           118/60 mmHg Patient Gender: F                      HR:           91 bpm. Exam Location:  Inpatient Procedure: 2D Echo, Cardiac Doppler and Color Doppler Indications:    CHF-Acute Diastolic I50.31  History:        Patient has no prior history of Echocardiogram examinations.                 CHF; Risk Factors:Hypertension and Current Smoker.  Sonographer:    Dondra Prader RVT RCS Referring Phys: [id number] RIPUDEEP K RAI  Sonographer Comments: Image acquisition challenging due to respiratory motion. IMPRESSIONS  1. Left ventricular ejection fraction, by estimation, is 60 to 65%. The left ventricle has normal function. The left ventricle has no regional wall motion abnormalities. Left ventricular diastolic parameters are consistent with Grade I diastolic  dysfunction (impaired relaxation).  2. Right ventricular systolic function is normal. The right ventricular size is normal.  3. The mitral valve is normal in structure. No evidence of mitral valve regurgitation. No evidence of mitral stenosis.  4. The aortic valve is tricuspid. There is moderate calcification of the aortic valve. Aortic valve regurgitation is not visualized. Aortic valve sclerosis/calcification is present, without any evidence of aortic stenosis.  5. The inferior vena cava is normal in size with greater than 50% respiratory variability, suggesting right atrial pressure of 3 mmHg. FINDINGS  Left Ventricle: Left ventricular ejection fraction, by estimation, is 60 to 65%. The left ventricle has normal function. The left ventricle has no regional wall motion abnormalities. The left ventricular internal cavity size was normal in size. There is  no left ventricular hypertrophy. Left ventricular diastolic parameters are consistent with Grade I diastolic dysfunction (impaired relaxation). Right Ventricle: The right ventricular size is normal. No increase in right ventricular wall thickness. Right ventricular systolic function is normal. Left Atrium: Left atrial size was normal in size. Right Atrium: Right atrial size was normal in size. Pericardium: There is no evidence of pericardial effusion. Mitral Valve: The mitral valve is normal in structure. No evidence of mitral valve regurgitation. No evidence of mitral valve stenosis. Tricuspid Valve: The tricuspid valve is normal in structure. Tricuspid valve regurgitation is trivial. No evidence of tricuspid stenosis. Aortic Valve: The aortic valve is tricuspid. There is moderate calcification of the aortic valve. Aortic valve regurgitation is not visualized. Aortic valve sclerosis/calcification is present, without any evidence of aortic stenosis. Aortic valve mean gradient measures 6.5 mmHg. Aortic valve peak gradient measures 12.0 mmHg. Aortic valve area, by  VTI measures 1.15 cm. Pulmonic Valve: The pulmonic valve was not well visualized. Pulmonic valve regurgitation is not visualized. No evidence of pulmonic stenosis. Aorta: The aortic root is normal in size and structure and the ascending aorta was not well visualized. Venous: The inferior vena cava is normal in size with greater than 50% respiratory variability, suggesting right atrial pressure of 3 mmHg. IAS/Shunts: No atrial level shunt detected by color flow Doppler.  LEFT VENTRICLE PLAX 2D LVIDd:         4.10 cm   Diastology LVIDs:         2.80 cm  LV e' medial:    8.49 cm/s LV PW:         0.80 cm   LV E/e' medial:  10.4 LV IVS:        0.90 cm   LV e' lateral:   9.25 cm/s LVOT diam:     1.70 cm   LV E/e' lateral: 9.5 LV SV:         35 LV SV Index:   20 LVOT Area:     2.27 cm  RIGHT VENTRICLE             IVC RV Basal diam:  3.80 cm     IVC diam: 1.60 cm RV S prime:     11.70 cm/s TAPSE (M-mode): 1.9 cm LEFT ATRIUM             Index        RIGHT ATRIUM           Index LA Vol (A2C):   28.3 ml 16.25 ml/m  RA Area:     11.30 cm LA Vol (A4C):   24.3 ml 13.92 ml/m  RA Volume:   26.80 ml  15.39 ml/m LA Biplane Vol: 31.1 ml 17.86 ml/m  AORTIC VALVE                     PULMONIC VALVE AV Area (Vmax):    1.08 cm      PV Vmax:       1.43 m/s AV Area (Vmean):   1.10 cm      PV Peak grad:  8.2 mmHg AV Area (VTI):     1.15 cm AV Vmax:           173.00 cm/s AV Vmean:          113.000 cm/s AV VTI:            0.304 m AV Peak Grad:      12.0 mmHg AV Mean Grad:      6.5 mmHg LVOT Vmax:         82.30 cm/s LVOT Vmean:        55.000 cm/s LVOT VTI:          0.154 m LVOT/AV VTI ratio: 0.51  AORTA Ao Root diam: 3.30 cm MITRAL VALVE MV Area (PHT): 4.21 cm    SHUNTS MV Decel Time: 180 msec    Systemic VTI:  0.15 m MV E velocity: 88.00 cm/s  Systemic Diam: 1.70 cm MV A velocity: 94.30 cm/s MV E/A ratio:  0.93 Arvilla Meres MD Electronically signed by Arvilla Meres MD Signature Date/Time: 06/14/2023/3:13:32 PM    Final    CT  Angio Chest PE W/Cm &/Or Wo Cm  Result Date: 06/13/2023 CLINICAL DATA:  Pulmonary embolism (PE) suspected, low to intermediate prob, positive D-dimer Pulmonary embolism (PE) suspected, high prob. EXAM: CT ANGIOGRAPHY CHEST WITH CONTRAST TECHNIQUE: Multidetector CT imaging of the chest was performed using the standard protocol during bolus administration of intravenous contrast. Multiplanar CT image reconstructions and MIPs were obtained to evaluate the vascular anatomy. RADIATION DOSE REDUCTION: This exam was performed according to the departmental dose-optimization program which includes automated exposure control, adjustment of the mA and/or kV according to patient size and/or use of iterative reconstruction technique. CONTRAST:  75mL OMNIPAQUE IOHEXOL 350 MG/ML SOLN COMPARISON:  Chest radiographs 06/13/2023. FINDINGS: Cardiovascular: Satisfactory opacification of the pulmonary arteries to the segmental level. No evidence of pulmonary embolism. Extensive coronary artery calcifications. Atherosclerotic calcifications of the thoracic aorta.  Mediastinum/Nodes: No enlarged mediastinal, hilar, or axillary lymph nodes. Thyroid gland, trachea, and esophagus demonstrate no significant findings. Lungs/Pleura: Moderate centrilobular emphysema. 13 x 7 mm solid nodule in the right upper lobe (axial image 36 series 7). Consolidative opacities in dependent portions of the left-greater-than-right lower lobes, suspicious for aspiration or infection. No pleural effusion or pneumothorax. Upper Abdomen: No acute abnormality. Musculoskeletal: No chest wall abnormality. No acute or significant osseous findings. Review of the MIP images confirms the above findings. IMPRESSION: 1. No evidence of pulmonary embolism. 2. Consolidative opacities in dependent portions of the left-greater-than-right lower lobes, suspicious for aspiration or infection. 3. 13 x 7 mm solid nodule in the right upper lobe. Per Fleischner Society Guidelines,  consider a non-contrast Chest CT at 3 months, a PET/CT, or tissue sampling. These guidelines do not apply to immunocompromised patients and patients with cancer. Follow up in patients with significant comorbidities as clinically warranted. Reference: Radiology. 2017; 284(1):228-43. Aortic Atherosclerosis (ICD10-I70.0) and Emphysema (ICD10-J43.9). Electronically Signed   By: Orvan Falconer M.D.   On: 06/13/2023 14:10   DG Chest Portable 1 View  Result Date: 06/13/2023 CLINICAL DATA:  Cough. EXAM: PORTABLE CHEST 1 VIEW COMPARISON:  06/11/2023 FINDINGS: Normal heart size and mediastinal contours. No signs of pleural effusion or edema. Asymmetric airspace disease is identified within the left lung base which appears similar to the previous exam. Right lung appears clear. Remote fracture deformity of the proximal right humerus. IMPRESSION: Persistent asymmetric airspace disease within the left base compatible with pneumonia. Electronically Signed   By: Signa Kell M.D.   On: 06/13/2023 11:25    Microbiology: Results for orders placed or performed during the hospital encounter of 06/13/23  SARS Coronavirus 2 by RT PCR (hospital order, performed in [facility] hospital lab) *cepheid single result test* Anterior Nasal Swab     Status: None   Collection Time: 06/13/23 10:53 AM   Specimen: Anterior Nasal Swab  Result Value Ref Range Status   SARS Coronavirus 2 by RT PCR NEGATIVE NEGATIVE Final    Comment: Performed at [facility], [address] [place], [place] [address]  Blood culture (routine x 2)     Status: None   Collection Time: 06/13/23  2:45 PM   Specimen: BLOOD RIGHT WRIST  Result Value Ref Range Status   Specimen Description BLOOD RIGHT WRIST  Final   Special Requests   Final    BOTTLES DRAWN AEROBIC AND ANAEROBIC Blood Culture results may not be optimal due to an inadequate volume of blood received in culture bottles   Culture   Final    NO GROWTH 5 DAYS Performed at [facility], [address], [place], [place] [address]    Report Status 06/18/2023 FINAL  Final  Blood culture (routine x 2)     Status: None   Collection Time: 06/13/23  2:50 PM   Specimen: BLOOD RIGHT ARM  Result Value Ref Range Status   Specimen Description BLOOD RIGHT ARM  Final   Special Requests   Final    BOTTLES DRAWN AEROBIC AND ANAEROBIC Blood Culture results may not be optimal due to an inadequate volume of blood received in culture bottles   Culture   Final    NO GROWTH 5 DAYS Performed at [facility], [address] [place], [place] [address]    Report Status 06/18/2023 FINAL  Final    Labs: CBC: Recent Labs  Lab 06/14/23 0314 06/15/23 0448 06/16/23 0419 06/17/23  4010 06/18/23 0834  WBC 22.8* 17.7* 14.9* 14.0* 12.0*  HGB 13.0 13.7 13.9 14.2 15.2*  HCT 38.2 41.1 43.1 43.4 46.1*  MCV 87.8 88.2 87.8 88.0 88.1  PLT 363 361 396 414* 413*   Basic Metabolic Panel: Recent Labs  Lab 06/13/23 1053 06/14/23 0314 06/15/23 0448 06/16/23 0419 06/17/23 0657 06/18/23 0834 06/20/23 0753  NA 137 139 137 137 137 136  --   K 3.1* 3.8 3.4* 3.9 3.7 3.8  --   CL 99 103 102 101 102 98  --   CO2 26 26 25 25 28 27   --   GLUCOSE 141* 158* 111* 100* 109* 106*  --   BUN 12 13 17 13 15 15   --   CREATININE 0.72 0.65 0.58 0.63 0.72 0.71 0.84  CALCIUM 9.4 8.9 8.6* 8.9 8.8* 9.0  --   MG 1.9  --   --   --   --   --   --    Liver Function Tests: Recent Labs  Lab 06/13/23 1053 06/14/23 0314  AST 28 20  ALT 15 15  ALKPHOS 80 65  BILITOT 1.1 0.4  PROT 7.1 6.2*  ALBUMIN 2.9* 2.3*   CBG: Recent Labs  Lab 06/17/23 0944 06/17/23 1149  GLUCAP 160* 128*    Discharge time spent: greater than 30 minutes.  Signed: Hughie Closs, MD [facility] 06/20/2023

## 2024-04-22 DIAGNOSIS — R000 Tachycardia, unspecified: Secondary | ICD-10-CM | POA: Diagnosis not present
